# Patient Record
Sex: Male | Born: 1970 | Race: Black or African American | Hispanic: No | Marital: Married | Smoking: Current every day smoker
Health system: Southern US, Community
[De-identification: ages and names within clinical notes are randomized; demographics above are authoritative.]

## PROBLEM LIST (undated history)

## (undated) DIAGNOSIS — J449 Chronic obstructive pulmonary disease, unspecified: Secondary | ICD-10-CM

## (undated) DIAGNOSIS — E119 Type 2 diabetes mellitus without complications: Secondary | ICD-10-CM

## (undated) HISTORY — DX: Chronic obstructive pulmonary disease, unspecified: J44.9

## (undated) HISTORY — PX: FOOT SURGERY: SHX648

## (undated) HISTORY — DX: Type 2 diabetes mellitus without complications: E11.9

---
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
## 1998-04-13 ENCOUNTER — Emergency Department (HOSPITAL_COMMUNITY): Admission: EM | Admit: 1998-04-13 | Discharge: 1998-04-14 | Payer: Self-pay | Admitting: Internal Medicine

## 2013-02-10 ENCOUNTER — Emergency Department (HOSPITAL_COMMUNITY)
Admission: EM | Admit: 2013-02-10 | Discharge: 2013-02-10 | Disposition: A | Discharge status: Home / Self Care | Payer: Worker's Compensation | Attending: Emergency Medicine | Admitting: Emergency Medicine

## 2013-02-10 ENCOUNTER — Encounter (HOSPITAL_COMMUNITY): Payer: Self-pay | Admitting: Emergency Medicine

## 2013-02-10 DIAGNOSIS — F17200 Nicotine dependence, unspecified, uncomplicated: Secondary | ICD-10-CM | POA: Insufficient documentation

## 2013-02-10 DIAGNOSIS — W268XXA Contact with other sharp object(s), not elsewhere classified, initial encounter: Secondary | ICD-10-CM | POA: Insufficient documentation

## 2013-02-10 DIAGNOSIS — S0100XA Unspecified open wound of scalp, initial encounter: Secondary | ICD-10-CM | POA: Insufficient documentation

## 2013-02-10 DIAGNOSIS — Z23 Encounter for immunization: Secondary | ICD-10-CM | POA: Insufficient documentation

## 2013-02-10 DIAGNOSIS — S0191XA Laceration without foreign body of unspecified part of head, initial encounter: Secondary | ICD-10-CM

## 2013-02-10 DIAGNOSIS — Y929 Unspecified place or not applicable: Secondary | ICD-10-CM | POA: Insufficient documentation

## 2013-02-10 DIAGNOSIS — Y9389 Activity, other specified: Secondary | ICD-10-CM | POA: Insufficient documentation

## 2013-02-10 MED ORDER — TETANUS-DIPHTH-ACELL PERTUSSIS 5-2.5-18.5 LF-MCG/0.5 IM SUSP
0.5000 mL | Freq: Once | INTRAMUSCULAR | Status: AC
  Administered 2013-02-10: 0.5 mL via INTRAMUSCULAR
  Filled 2013-02-10: qty 0.5

## 2013-02-10 MED ORDER — IBUPROFEN 800 MG PO TABS
800.0000 mg | ORAL_TABLET | Freq: Once | ORAL | Status: AC
  Administered 2013-02-10: 800 mg via ORAL
  Filled 2013-02-10: qty 1

## 2013-02-10 MED ORDER — ACETAMINOPHEN 500 MG PO TABS: 500.0000 mg | ORAL_TABLET | Freq: Four times a day (QID) | ORAL | Status: DC | PRN

## 2013-02-10 NOTE — ED Provider Notes (Signed)
CSN: [id number]     Arrival date & time 02/10/13  0257 History     First MD Initiated Contact with Patient 02/10/13 [phone]     Chief Complaint  Patient presents with  . Head Laceration   (Consider location/radiation/quality/duration/timing/severity/associated sxs/prior Treatment) HPI Pt is a 42yo male presenting with laceration of his scalp after being cut after raising up under a piece of equipment at work at [employer].  Pt c/o mild aching headache, same as previous headaches, 4/10.  Moderate bleeding immediately after incident however bleeding well managed PTA.  Pt is otherwise healthy.  Not on blood thinners.  Last tetanus unknown. Denies LOC, n/v, or change in vision.   History reviewed. No pertinent past medical history. Past Surgical History  Procedure Laterality Date  . Foot surgery     No family history on file. History  Substance Use Topics  . Smoking status: Current Every Day Smoker  . Smokeless tobacco: Not on file  . Alcohol Use: No    Review of Systems  Skin: Positive for wound.  Neurological: Positive for headaches.  All other systems reviewed and are negative.    Allergies  Review of patient's allergies indicates no known allergies.  Home Medications   Current Outpatient Rx  Name  Route  Sig  Dispense  Refill  . acetaminophen (TYLENOL) 500 MG tablet   Oral   Take 1 tablet (500 mg total) by mouth every 6 (six) hours as needed for pain.   30 tablet   0    BP 132/80  Pulse 78  Temp(Src) 98.5 F (36.9 C) (Oral)  Resp 18  SpO2 99% Physical Exam  Nursing note and vitals reviewed. Constitutional: He is oriented to person, place, and time. He appears well-developed and well-nourished.  HENT:  Head: Normocephalic. Head is with laceration.    Right Ear: Hearing, tympanic membrane, external ear and ear canal normal.  Left Ear: Hearing, tympanic membrane, external ear and ear canal normal.  Nose: Nose normal.  Mouth/Throat: Uvula is midline and oropharynx is  clear and moist. No oropharyngeal exudate.  Eyes: Conjunctivae and EOM are normal. Pupils are equal, round, and reactive to light. Right eye exhibits no discharge. Left eye exhibits no discharge. No scleral icterus.  Neck: Normal range of motion. Neck supple.  Cardiovascular: Normal rate.   Pulmonary/Chest: Effort normal.  Musculoskeletal: Normal range of motion.  Neurological: He is alert and oriented to person, place, and time. He has normal strength. No cranial nerve deficit or sensory deficit. He displays a negative Romberg sign. Coordination normal. GCS eye subscore is 4. GCS verbal subscore is 5. GCS motor subscore is 6.  Skin: Skin is warm and dry.  Psychiatric: He has a normal mood and affect. His behavior is normal.    ED Course   Procedures  LACERATION REPAIR Performed by: Junius Finner A. Authorized by: Ina Homes Consent: Verbal consent obtained. Risks and benefits: risks, benefits and alternatives were discussed Consent given by: patient Patient identity confirmed: provided demographic data Prepped and Draped in normal sterile fashion Wound explored  Laceration Location: scalp  Laceration Length: 3cm  No Foreign Bodies seen or palpated  Local anesthetic: none  Irrigation method: syringe Amount of cleaning: standard  Skin closure: staples  Number of staples: 2  Patient tolerance: Patient tolerated the procedure well with no immediate complications.     Labs Reviewed - No data to display No results found. 1. Laceration of head, initial encounter     MDM  Pt has simple lac to scalp.  2 staples placed.  Pt updated with tetanus in ED.  Return precautions provided. Pt is to f/u with PCP or return to ER for staple removal in 7-10 days.  Pt verbalized understanding and agreement with tx plan.    Junius Finner, PA-C 02/11/13 2036

## 2013-02-10 NOTE — ED Notes (Signed)
Patient is alert and oriented x3.  He was given DC instructions and follow up visit instructions.  Patient gave verbal understanding.  He was DC ambulatory under his own power to home.  V/S stable.  He was not showing any signs of distress on DC 

## 2013-02-10 NOTE — ED Notes (Addendum)
Pt raised up under piece of equipment while at work tonight at [employer]. Dried blood noted to L side of head, pt denies LOC No drug screen required.

## 2013-02-11 NOTE — ED Provider Notes (Signed)
Medical screening examination/treatment/procedure(s) were performed by non-physician practitioner and as supervising physician I was immediately available for consultation/collaboration.   Nena Hampe L Daily Doe, MD 02/11/13 2251 

## 2016-08-16 ENCOUNTER — Emergency Department (HOSPITAL_COMMUNITY): Payer: Managed Care, Other (non HMO)

## 2016-08-16 ENCOUNTER — Emergency Department (HOSPITAL_COMMUNITY)
Admission: EM | Admit: 2016-08-16 | Discharge: 2016-08-17 | Disposition: A | Discharge status: Home / Self Care | Payer: Managed Care, Other (non HMO) | Attending: Emergency Medicine | Admitting: Emergency Medicine

## 2016-08-16 ENCOUNTER — Encounter (HOSPITAL_COMMUNITY): Payer: Self-pay

## 2016-08-16 DIAGNOSIS — R05 Cough: Secondary | ICD-10-CM | POA: Diagnosis present

## 2016-08-16 DIAGNOSIS — J111 Influenza due to unidentified influenza virus with other respiratory manifestations: Secondary | ICD-10-CM | POA: Insufficient documentation

## 2016-08-16 DIAGNOSIS — F17200 Nicotine dependence, unspecified, uncomplicated: Secondary | ICD-10-CM | POA: Diagnosis not present

## 2016-08-16 DIAGNOSIS — R69 Illness, unspecified: Secondary | ICD-10-CM

## 2016-08-16 MED ORDER — BENZONATATE 200 MG PO CAPS: 200.0000 mg | ORAL_CAPSULE | Freq: Three times a day (TID) | ORAL | 0 refills | Status: DC | PRN

## 2016-08-16 MED ORDER — BENZOCAINE-MENTHOL 6-10 MG MT LOZG: 1.0000 | LOZENGE | OROMUCOSAL | 0 refills | Status: DC | PRN

## 2016-08-16 MED ORDER — GUAIFENESIN-CODEINE 100-10 MG/5ML PO SYRP: 5.0000 mL | ORAL_SOLUTION | Freq: Every evening | ORAL | 0 refills | Status: DC | PRN

## 2016-08-16 MED ORDER — IPRATROPIUM-ALBUTEROL 0.5-2.5 (3) MG/3ML IN SOLN
3.0000 mL | Freq: Once | RESPIRATORY_TRACT | Status: AC
  Administered 2016-08-16: 3 mL via RESPIRATORY_TRACT
  Filled 2016-08-16: qty 3

## 2016-08-16 NOTE — ED Notes (Signed)
See EDP assessment 

## 2016-08-16 NOTE — ED Provider Notes (Signed)
[facility] DEPT Provider Note   CSN: [id number] Arrival date & time: 08/16/16  1854   By signing my name below, I, Eunice Blase, attest that this documentation has been prepared under the direction and in the presence of [facility] [place]. Electronically Signed: Eunice Blase, Scribe. 08/16/16. 10:51 PM.   History   Chief Complaint Chief Complaint  Patient presents with  . Influenza   The history is provided by the patient and medical records. No language interpreter was used.    10:32 PM Pt not in the room.  HPI Comments: Edward Downs is a 46 y.o. male who presents to the Emergency Department complaining of gradually worsening throat discomfort x ~4 days. Pt notes associated body aches, productive cough, bloody cough x 1 today, congestion, fever, headache, sinus pain, abdominal pain that has subsided and wheezing. He adds he drank tea and took cough drops at home with no relief. Pt notes he is a smoker.  History reviewed. No pertinent past medical history.  There are no active problems to display for this patient.   Past Surgical History:  Procedure Laterality Date  . FOOT SURGERY         Home Medications    Prior to Admission medications   Medication Sig Start Date End Date Taking? Authorizing Provider  acetaminophen (TYLENOL) 500 MG tablet Take 1 tablet (500 mg total) by mouth every 6 (six) hours as needed for pain. 02/10/13   Noland Fordyce, PA-C  benzocaine-menthol (CHLORASEPTIC) 6-10 MG lozenge Take 1 lozenge by mouth as needed for sore throat. 08/16/16   Jenny Lai Bunnie Pion, NP  benzonatate (TESSALON) 200 MG capsule Take 1 capsule (200 mg total) by mouth 3 (three) times daily as needed for cough. 08/16/16   Samamtha Tiegs Bunnie Pion, NP  guaiFENesin-codeine (ROBITUSSIN AC) 100-10 MG/5ML syrup Take 5 mLs by mouth at bedtime as needed for cough. 08/16/16   Johni Narine Bunnie Pion, NP    Family History No family history on file.  Social History Social History  Substance Use Topics  .  Smoking status: Current Every Day Smoker  . Smokeless tobacco: Not on file  . Alcohol use No     Allergies   Patient has no known allergies.   Review of Systems Review of Systems  Constitutional: Positive for fatigue and fever.  HENT: Positive for congestion, rhinorrhea, sinus pain, sinus pressure and sore throat.   Respiratory: Positive for cough and wheezing.   Gastrointestinal: Positive for abdominal pain.  Musculoskeletal: Positive for arthralgias and myalgias.  All other systems reviewed and are negative.    Physical Exam Updated Vital Signs BP 123/79   Pulse 88   Temp 99.6 F (37.6 C) (Oral)   Resp 18   Ht 6\' 3"  (1.905 m)   Wt 175 lb (79.4 kg)   SpO2 100%   BMI 21.87 kg/m   Physical Exam  Constitutional: He is oriented to person, place, and time. He appears well-developed and well-nourished. No distress.  HENT:  Head: Normocephalic and atraumatic.  Right Ear: Tympanic membrane normal.  Left Ear: Tympanic membrane normal.  Nose: Right sinus exhibits no maxillary sinus tenderness and no frontal sinus tenderness. Left sinus exhibits no maxillary sinus tenderness and no frontal sinus tenderness.  Mouth/Throat: Uvula is midline and mucous membranes are normal. Posterior oropharyngeal erythema (mild) present. No oropharyngeal exudate or posterior oropharyngeal edema.  Eyes: EOM are normal. Pupils are equal, round, and reactive to light. No scleral icterus.  Neck: Normal range of motion. Neck supple.  Cardiovascular: Normal rate and regular rhythm.   Pulmonary/Chest: Effort normal. He has wheezes (inspiratory and expiratory).  Abdominal: Soft. There is no tenderness.  Musculoskeletal: Normal range of motion.  Lymphadenopathy:    He has no cervical adenopathy.  Neurological: He is alert and oriented to person, place, and time.  Skin: Skin is warm and dry. No rash noted.  Psychiatric: He has a normal mood and affect. His behavior is normal.  Nursing note and vitals  reviewed.    ED Treatments / Results  DIAGNOSTIC STUDIES: Oxygen Saturation is 100% on RA, normal by my interpretation.    COORDINATION OF CARE: 10:50 PM Discussed treatment plan with pt at bedside and pt agreed to plan. Will provide breathing treatment, order imaging and reassess.  Labs (all labs ordered are listed, but only abnormal results are displayed)  Radiology Dg Chest 2 View  Result Date: 08/16/2016 CLINICAL DATA:  Hemoptysis and wheezing, fever. EXAM: CHEST  2 VIEW COMPARISON:  None. FINDINGS: Cardiomediastinal silhouette is normal. No pleural effusions or focal consolidations. Trachea projects midline and there is no pneumothorax. Soft tissue planes and included osseous structures are non-suspicious. Mid thoracic dextroscoliosis and mild degenerative change. IMPRESSION: Negative chest radiograph. Electronically Signed   By: Elon Alas M.D.   On: 08/16/2016 23:23    Procedures Procedures (including critical care time)  Medications Ordered in ED Medications  albuterol (PROVENTIL HFA;VENTOLIN HFA) 108 (90 Base) MCG/ACT inhaler 2 puff (not administered)  ipratropium-albuterol (DUONEB) 0.5-2.5 (3) MG/3ML nebulizer solution 3 mL (3 mLs Nebulization Given 08/16/16 2333)     Initial Impression / Assessment and Plan / ED Course  I have reviewed the triage vital signs and the nursing notes.  Pertinent imaging results that were available during my care of the patient were reviewed by me and considered in my medical decision making (see chart for details).   Patient improved with neb treatment   I personally performed the services described in this documentation, which was scribed in my presence. The recorded information has been reviewed and is accurate.   Final Clinical Impressions(s) / ED Diagnoses  SUBJECTIVE:  Edward Downs is a 46 y.o. male who present complaining of flu-like symptoms: fevers, chills, myalgias, congestion, sore throat and cough for 4 days. Denies  dyspnea  OBJECTIVE: Appears moderately ill but not toxic; temperature as noted in vitals. Ears normal. Throat and pharynx normal.  Neck supple. No adenopathy in the neck. Sinuses non tender. The chest is clear.  ASSESSMENT: Influenza  PLAN: Symptomatic therapy suggested: rest, increase fluids, gargle prn for sore throat, use mist of vaporizer prn and return prn if symptoms persist or worsen . Cough medication and Albuterol inhaler.  Final diagnoses:  Influenza-like illness    New Prescriptions New Prescriptions   BENZOCAINE-MENTHOL (CHLORASEPTIC) 6-10 MG LOZENGE    Take 1 lozenge by mouth as needed for sore throat.   BENZONATATE (TESSALON) 200 MG CAPSULE    Take 1 capsule (200 mg total) by mouth 3 (three) times daily as needed for cough.   GUAIFENESIN-CODEINE (ROBITUSSIN AC) 100-10 MG/5ML SYRUP    Take 5 mLs by mouth at bedtime as needed for cough.     [address] [place], NP 08/17/16 0010    Lacretia Leigh, MD 08/19/16 [phone]

## 2016-08-16 NOTE — ED Triage Notes (Signed)
Pt states that since Friday has had flu like symptoms, headache, head congestion, cough, nausea. Denies fevers

## 2016-08-17 MED ORDER — ALBUTEROL SULFATE HFA 108 (90 BASE) MCG/ACT IN AERS
2.0000 | INHALATION_SPRAY | RESPIRATORY_TRACT | Status: DC | PRN
  Administered 2016-08-17: 2 via RESPIRATORY_TRACT
  Filled 2016-08-17: qty 6.7

## 2017-11-15 IMAGING — CR DG CHEST 2V
2 series · 2 of 2 positions shown · non-contrast
Comparison: None.

CLINICAL DATA: Hemoptysis and wheezing, fever.

EXAM:
CHEST  2 VIEW

[chest pa]
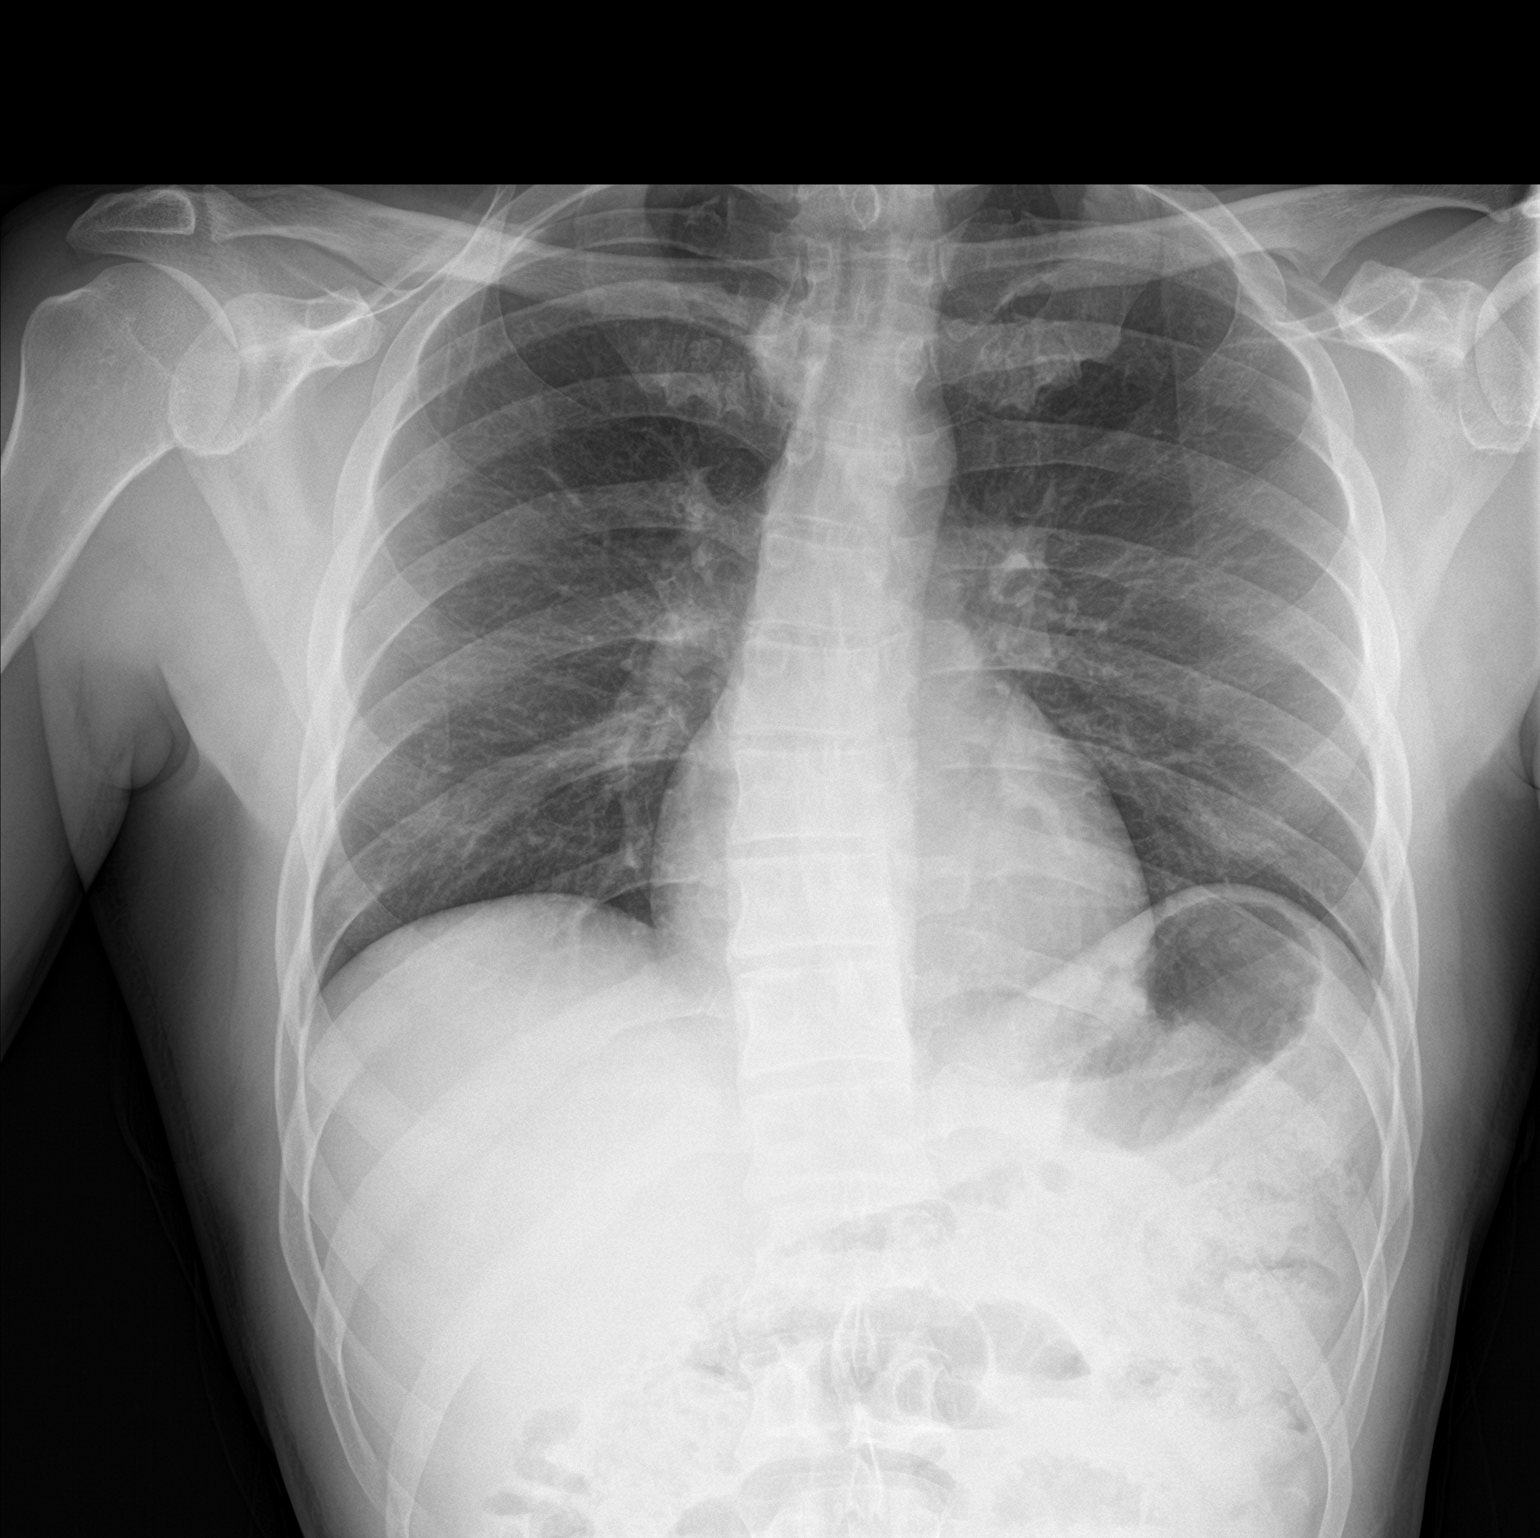

[chest lat]
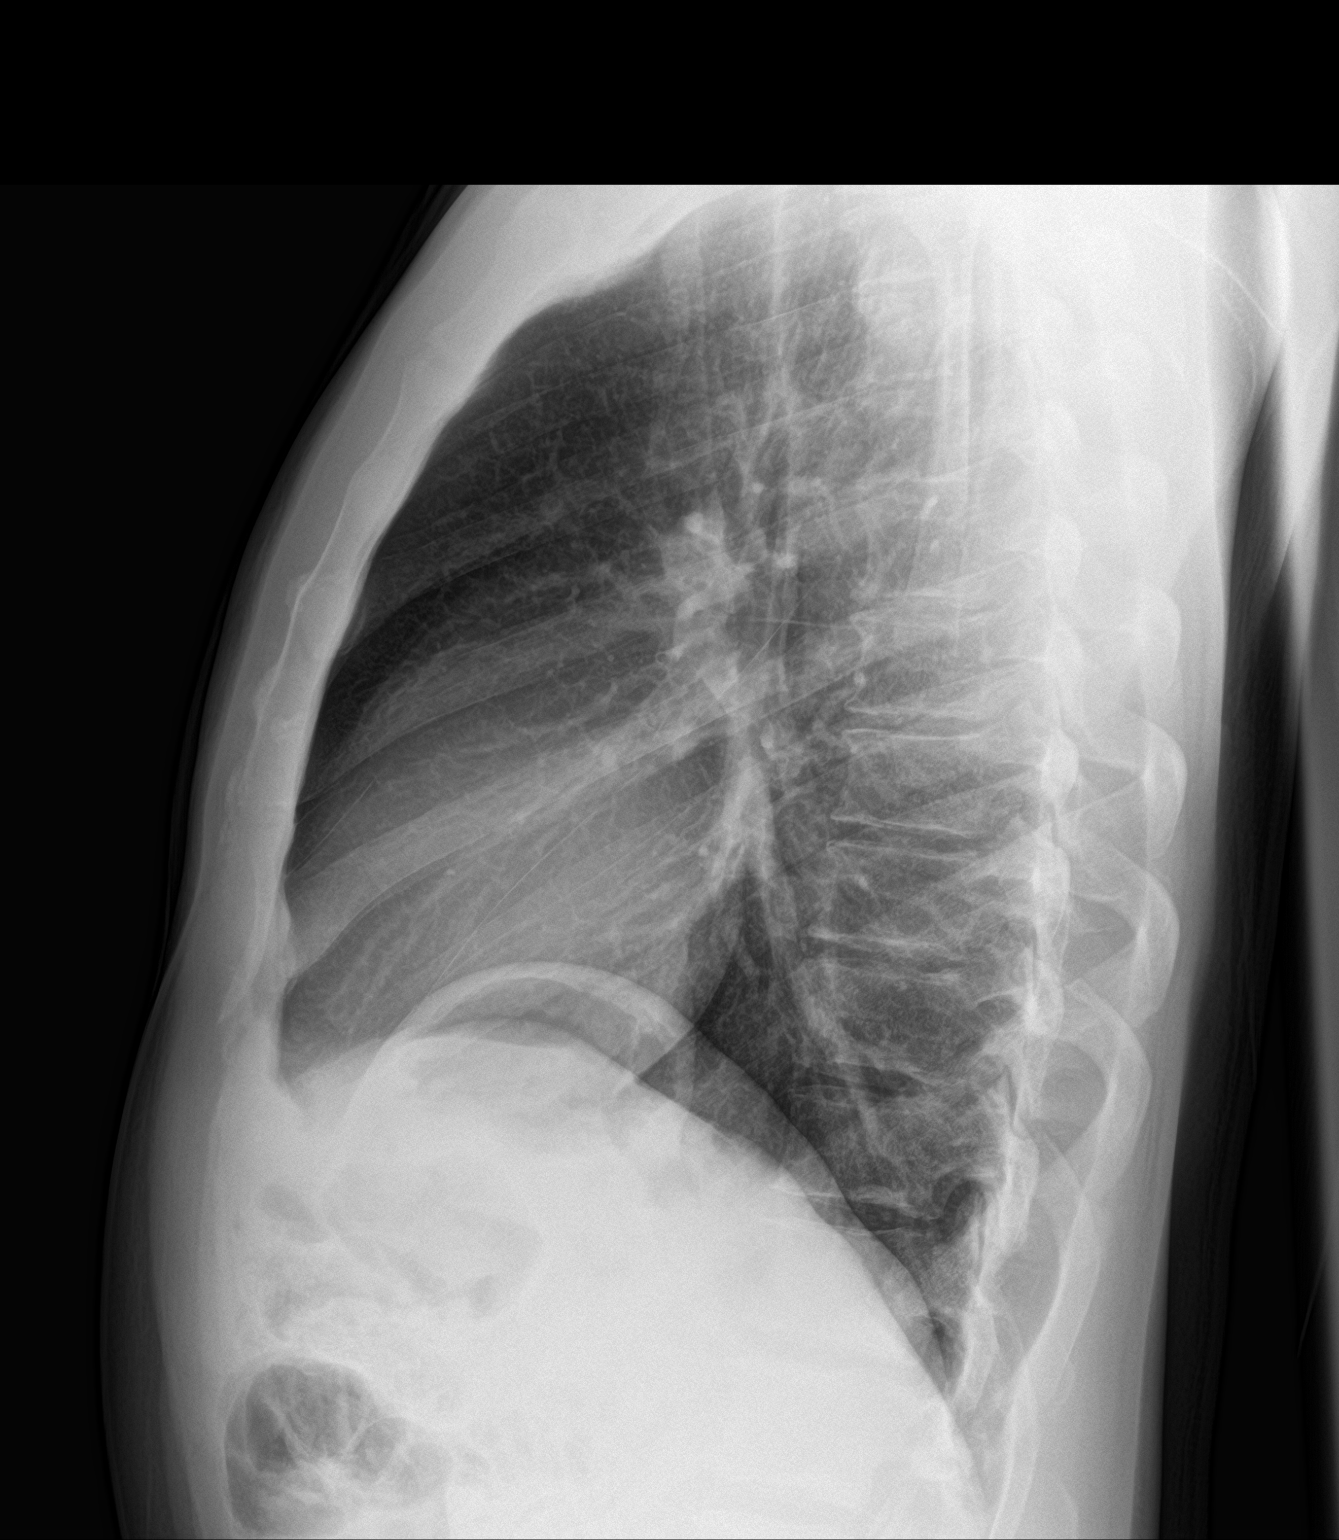

[2 of 2 positions shown; findings below may reference images not displayed]

FINDINGS: Cardiomediastinal silhouette is normal. No pleural effusions or
focal consolidations. Trachea projects midline and there is no
pneumothorax. Soft tissue planes and included osseous structures are
non-suspicious. Mid thoracic dextroscoliosis and mild degenerative
change.
IMPRESSION: Negative chest radiograph.

## 2018-02-14 ENCOUNTER — Encounter: Payer: Self-pay | Admitting: Medical

## 2018-07-05 DIAGNOSIS — L403 Pustulosis palmaris et plantaris: Secondary | ICD-10-CM

## 2018-07-05 HISTORY — DX: Pustulosis palmaris et plantaris: L40.3

## 2019-03-09 ENCOUNTER — Other Ambulatory Visit: Payer: Self-pay

## 2019-03-09 ENCOUNTER — Encounter: Payer: Self-pay | Admitting: Emergency Medicine

## 2019-03-09 ENCOUNTER — Ambulatory Visit
Admission: EM | Admit: 2019-03-09 | Discharge: 2019-03-09 | Disposition: A | Discharge status: Home / Self Care | Payer: Managed Care, Other (non HMO) | Attending: Physician Assistant | Admitting: Physician Assistant

## 2019-03-09 DIAGNOSIS — R52 Pain, unspecified: Secondary | ICD-10-CM | POA: Diagnosis not present

## 2019-03-09 DIAGNOSIS — R430 Anosmia: Secondary | ICD-10-CM

## 2019-03-09 DIAGNOSIS — Z20828 Contact with and (suspected) exposure to other viral communicable diseases: Secondary | ICD-10-CM | POA: Diagnosis not present

## 2019-03-09 DIAGNOSIS — Z20822 Contact with and (suspected) exposure to covid-19: Secondary | ICD-10-CM

## 2019-03-09 DIAGNOSIS — R432 Parageusia: Secondary | ICD-10-CM | POA: Diagnosis not present

## 2019-03-09 MED ORDER — ONDANSETRON 4 MG PO TBDP: 4.0000 mg | ORAL_TABLET | Freq: Three times a day (TID) | ORAL | 0 refills | Status: DC | PRN

## 2019-03-09 NOTE — Discharge Instructions (Signed)
As discussed, cannot rule out COVID. Currently, no alarming signs. Testing ordered. I would like you to quarantine until testing results. You can take tylenol for body aches/fever. You can take zofran for nausea/vomiting. Keep hydrated, urine should be clear to pale yellow in color. If experiencing shortness of breath, trouble breathing, call 911 and provide them with your current situation.

## 2019-03-09 NOTE — ED Triage Notes (Signed)
Patient presents to [facility] for assessment of muscle aches with movement x 2 days, with some fatigue, headaches, and dizziness.  C/o loss of sense of taste.  Denies fevers or chills.  Denies cough, nasal congestion or sore throat.

## 2019-03-09 NOTE — ED Provider Notes (Signed)
[name]    CSN: [id number] Arrival date & time: 03/09/19  1336      History   Chief Complaint Chief Complaint  Patient presents with  . Fatigue    HPI Edward Downs is a 48 y.o. male.   48 year old male comes in for 2-day history of body aches, fatigue, headache, dizziness.  He also complains of loss of taste and smell.  She had nausea and vomiting after eating an orange today, but has since then been able to tolerate oral intake.  Denies fever, chills.  Denies cough, congestion, sore throat.  Denies shortness of breath, wheezing.  Denies abdominal pain, diarrhea.  No obvious sick contact.  Works 2 jobs with no obvious COVID contact.  Has not taken any medications.     History reviewed. No pertinent past medical history.  There are no active problems to display for this patient.   Past Surgical History:  Procedure Laterality Date  . FOOT SURGERY         Home Medications    Prior to Admission medications   Medication Sig Start Date End Date Taking? Authorizing Provider  acetaminophen (TYLENOL) 500 MG tablet Take 1 tablet (500 mg total) by mouth every 6 (six) hours as needed for pain. 02/10/13   [name], PA-C  ondansetron (ZOFRAN ODT) 4 MG disintegrating tablet Take 1 tablet (4 mg total) by mouth every 8 (eight) hours as needed for nausea or vomiting. 03/09/19   [name], PA-C    Family History Family History  Problem Relation Age of Onset  . Diabetes Mother     Social History Social History   Tobacco Use  . Smoking status: Current Every Day Smoker    Packs/day: 0.50  . Smokeless tobacco: Never Used  Substance Use Topics  . Alcohol use: No  . Drug use: No     Allergies   Patient has no known allergies.   Review of Systems Review of Systems  Reason unable to perform ROS: See HPI as above.     Physical Exam Triage Vital Signs ED Triage Vitals [03/09/19 1346]  Enc Vitals Group     BP 113/78     Pulse Rate (!) 116   Resp 18     Temp 98.9 F (37.2 C)     Temp Source Oral     SpO2 97 %     Weight      Height      Head Circumference      Peak Flow      Pain Score 5     Pain Loc      Pain Edu?      Excl. in [place]?    No data found.  Updated Vital Signs BP 113/78 (BP Location: Left Arm)   Pulse (!) 116   Temp 98.9 F (37.2 C) (Oral)   Resp 18   SpO2 97%   Physical Exam Constitutional:      General: He is not in acute distress.    Appearance: Normal appearance. He is not ill-appearing, toxic-appearing or diaphoretic.  HENT:     Head: Normocephalic and atraumatic.     Mouth/Throat:     Mouth: Mucous membranes are moist.     Pharynx: Oropharynx is clear. Uvula midline.  Neck:     Musculoskeletal: Normal range of motion and neck supple.  Cardiovascular:     Rate and Rhythm: Regular rhythm. Tachycardia present.     Heart sounds: Normal heart sounds. No  murmur. No friction rub. No gallop.   Pulmonary:     Effort: Pulmonary effort is normal. No accessory muscle usage, prolonged expiration, respiratory distress or retractions.     Comments: Lungs clear to auscultation without adventitious lung sounds. Skin:    General: Skin is warm and dry.  Neurological:     General: No focal deficit present.     Mental Status: He is alert and oriented to person, place, and time.     Comments: Patient able to ambulate on own without difficulty      UC Treatments / Results  Labs (all labs ordered are listed, but only abnormal results are displayed) Labs Reviewed  NOVEL CORONAVIRUS, NAA    EKG   Radiology No results found.  Procedures Procedures (including critical care time)  Medications Ordered in UC Medications - No data to display  Initial Impression / Assessment and Plan / UC Course  I have reviewed the triage vital signs and the nursing notes.  Pertinent labs & imaging results that were available during my care of the patient were reviewed by me and considered in my medical decision  making (see chart for details).    No alarming signs on exam.  Patient speaking in full sentences without respiratory distress.  COVID testing ordered.  Patient to quarantine until testing results return.  Symptomatic treatment discussed.  Push fluids.  Return precautions given.  Patient expresses understanding and agrees to plan.  Final Clinical Impressions(s) / UC Diagnoses   Final diagnoses:  Suspected Covid-19 Virus Infection  Loss of taste  Loss of smell  Body aches   ED Prescriptions    Medication Sig Dispense Auth. Provider   ondansetron (ZOFRAN ODT) 4 MG disintegrating tablet Take 1 tablet (4 mg total) by mouth every 8 (eight) hours as needed for nausea or vomiting. 15 tablet Tobin Chad, [place] 03/09/19 1432

## 2019-03-10 ENCOUNTER — Other Ambulatory Visit: Payer: Self-pay

## 2019-03-10 ENCOUNTER — Ambulatory Visit
Admission: EM | Admit: 2019-03-10 | Discharge: 2019-03-10 | Disposition: A | Discharge status: Home / Self Care | Payer: Managed Care, Other (non HMO) | Attending: Physician Assistant | Admitting: Physician Assistant

## 2019-03-10 DIAGNOSIS — R2242 Localized swelling, mass and lump, left lower limb: Secondary | ICD-10-CM | POA: Diagnosis not present

## 2019-03-10 DIAGNOSIS — L03116 Cellulitis of left lower limb: Secondary | ICD-10-CM | POA: Diagnosis not present

## 2019-03-10 LAB — NOVEL CORONAVIRUS, NAA: SARS-CoV-2, NAA: NOT DETECTED

## 2019-03-10 MED ORDER — CEPHALEXIN 500 MG PO CAPS: 500.0000 mg | ORAL_CAPSULE | Freq: Four times a day (QID) | ORAL | 0 refills | Status: DC

## 2019-03-10 MED ORDER — CETIRIZINE HCL 10 MG PO TABS: 10.0000 mg | ORAL_TABLET | Freq: Two times a day (BID) | ORAL | 0 refills | Status: DC

## 2019-03-10 NOTE — ED Triage Notes (Signed)
Per pt he started hurting in his left leg yesterday and noticed redness and swelling. Very splotchy and red all the way around his lower calf area of his leg. No obvious bite. No fevers, leg is warm to touch

## 2019-03-10 NOTE — Discharge Instructions (Signed)
Start keflex as directed for infection. Zyrtec for possible insect bite. As discussed, lower suspicion for blood clot right now. However, if swelling increases, to the whole leg with pain to the calf, go to the ED for further evaluation needed.

## 2019-03-10 NOTE — ED Provider Notes (Signed)
[name]    CSN: [id number] Arrival date & time: 03/10/19  0816      History   Chief Complaint Chief Complaint  Patient presents with  . Leg Swelling    HPI Edward Downs is a 48 y.o. male.   48 year old male comes in for left lower leg swelling, redness, pain that started last night. He was seen yesterday for URI symptom, possible COVID, with test pending. Later that day, started noticing current symptoms. He denies injury/trauma. He questions insect bite, though did not see any bugs. Denies fever, chills. Denies long travels, history of DVT.      No past medical history on file.  There are no active problems to display for this patient.   Past Surgical History:  Procedure Laterality Date  . FOOT SURGERY         Home Medications    Prior to Admission medications   Medication Sig Start Date End Date Taking? Authorizing Provider  acetaminophen (TYLENOL) 500 MG tablet Take 1 tablet (500 mg total) by mouth every 6 (six) hours as needed for pain. 02/10/13   [name], PA-C  cephALEXin (KEFLEX) 500 MG capsule Take 1 capsule (500 mg total) by mouth 4 (four) times daily. 03/10/19   [name], Lachae Hohler V, PA-C  cetirizine (ZYRTEC ALLERGY) 10 MG tablet Take 1 tablet (10 mg total) by mouth 2 (two) times daily. 03/10/19   [name], Dell Briner V, PA-C  ondansetron (ZOFRAN ODT) 4 MG disintegrating tablet Take 1 tablet (4 mg total) by mouth every 8 (eight) hours as needed for nausea or vomiting. 03/09/19   [name], PA-C    Family History Family History  Problem Relation Age of Onset  . Diabetes Mother     Social History Social History   Tobacco Use  . Smoking status: Current Every Day Smoker    Packs/day: 0.50  . Smokeless tobacco: Never Used  Substance Use Topics  . Alcohol use: No  . Drug use: No     Allergies   Patient has no known allergies.   Review of Systems Review of Systems  Reason unable to perform ROS: See HPI as above.     Physical Exam Triage Vital  Signs ED Triage Vitals [03/10/19 0831]  Enc Vitals Group     BP 126/87     Pulse Rate 99     Resp 18     Temp 98.5 F (36.9 C)     Temp Source Oral     SpO2 99 %     Weight      Height      Head Circumference      Peak Flow      Pain Score 7     Pain Loc      Pain Edu?      Excl. in [place]?    No data found.  Updated Vital Signs BP 126/87 (BP Location: Right Arm)   Pulse 99   Temp 98.5 F (36.9 C) (Oral)   Resp 18   SpO2 99%   Physical Exam Constitutional:      General: He is not in acute distress.    Appearance: He is well-developed. He is not diaphoretic.  HENT:     Head: Normocephalic and atraumatic.  Eyes:     Conjunctiva/sclera: Conjunctivae normal.     Pupils: Pupils are equal, round, and reactive to light.  Pulmonary:     Effort: Pulmonary effort is normal. No respiratory distress.  Skin:    General: Skin is warm and dry.     Comments: Patches of erythema, warmth, swelling to the left lowe leg. No pitting edema. Mild induration. No calf swelling, homans.   Neurological:     Mental Status: He is alert and oriented to person, place, and time.            UC Treatments / Results  Labs (all labs ordered are listed, but only abnormal results are displayed) Labs Reviewed - No data to display  EKG   Radiology No results found.  Procedures Procedures (including critical care time)  Medications Ordered in UC Medications - No data to display  Initial Impression / Assessment and Plan / UC Course  I have reviewed the triage vital signs and the nursing notes.  Pertinent labs & imaging results that were available during my care of the patient were reviewed by me and considered in my medical decision making (see chart for details).    Lower suspicion of DVT at this time given no diffuse swelling, calf swelling, homans sign. Patchy erythema with no obvious insect bites. Given tenderness and warmth, will cover for cellulitis with keflex. Will start  zyrtec to cover for insect bites. Return precautions given. Discussed if needing to go to ED for further evaluation, will need to inform them he has current COVID testing pending. Patient expresses understanding and agrees to plan.  Final Clinical Impressions(s) / UC Diagnoses   Final diagnoses:  Localized swelling of left lower leg  Cellulitis of left lower leg   ED Prescriptions    Medication Sig Dispense Auth. Provider   cephALEXin (KEFLEX) 500 MG capsule  (Status: Discontinued) Take 1 capsule (500 mg total) by mouth 4 (four) times daily. 28 capsule Todrick Siedschlag V, PA-C   cetirizine (ZYRTEC ALLERGY) 10 MG tablet  (Status: Discontinued) Take 1 tablet (10 mg total) by mouth 2 (two) times daily. 20 tablet [place] Kinyon V, PA-C   cephALEXin (KEFLEX) 500 MG capsule Take 1 capsule (500 mg total) by mouth 4 (four) times daily. 28 capsule [place] Leap V, PA-C   cetirizine (ZYRTEC ALLERGY) 10 MG tablet Take 1 tablet (10 mg total) by mouth 2 (two) times daily. 20 tablet Tobin Chad, PA-C 03/10/19 1100

## 2019-03-11 ENCOUNTER — Emergency Department (HOSPITAL_COMMUNITY)
Admission: EM | Admit: 2019-03-11 | Discharge: 2019-03-11 | Disposition: A | Discharge status: Home / Self Care | Payer: Managed Care, Other (non HMO) | Attending: Emergency Medicine | Admitting: Emergency Medicine

## 2019-03-11 ENCOUNTER — Other Ambulatory Visit: Payer: Self-pay

## 2019-03-11 ENCOUNTER — Encounter (HOSPITAL_COMMUNITY): Payer: Self-pay

## 2019-03-11 DIAGNOSIS — M79662 Pain in left lower leg: Secondary | ICD-10-CM | POA: Diagnosis present

## 2019-03-11 DIAGNOSIS — L03116 Cellulitis of left lower limb: Secondary | ICD-10-CM | POA: Insufficient documentation

## 2019-03-11 DIAGNOSIS — Z79899 Other long term (current) drug therapy: Secondary | ICD-10-CM | POA: Diagnosis not present

## 2019-03-11 DIAGNOSIS — F17200 Nicotine dependence, unspecified, uncomplicated: Secondary | ICD-10-CM | POA: Insufficient documentation

## 2019-03-11 MED ORDER — CLINDAMYCIN HCL 300 MG PO CAPS: 300.0000 mg | ORAL_CAPSULE | Freq: Three times a day (TID) | ORAL | 0 refills | Status: AC

## 2019-03-11 NOTE — ED Triage Notes (Signed)
Pt states he was seen at [facility] for same yesterday. Pt states he was given abx yesterday and has not had relief from pain in left lower leg. Leg warm, red.

## 2019-03-11 NOTE — ED Notes (Signed)
MD [name] marking skin area to watch for increased inflammation

## 2019-03-11 NOTE — Discharge Instructions (Addendum)
Please return to the ED if redness and swelling worsens.

## 2019-03-11 NOTE — ED Provider Notes (Signed)
[facility] DEPT Provider Note   CSN: [id number] Arrival date & time: 03/11/19  1240     History   Chief Complaint Chief Complaint  Patient presents with  . Leg Pain    HPI Edward Downs is a 48 y.o. male.     Patient currently be treated for cellulitis of the left lower extremity, was having some tingling sensation in this area.  Currently on Keflex.  Denies any fever, chills.  Denies any trauma.  The history is provided by the patient.  Leg Pain Location:  Leg Leg location:  L lower leg Pain details:    Quality:  Aching   Radiates to:  Does not radiate   Severity:  Mild   Onset quality:  Gradual   Timing:  Intermittent   Progression:  Waxing and waning Prior injury to area:  No Relieved by:  Nothing Worsened by:  Nothing Associated symptoms: swelling   Associated symptoms: no back pain, no decreased ROM, no fatigue and no fever     History reviewed. No pertinent past medical history.  There are no active problems to display for this patient.   Past Surgical History:  Procedure Laterality Date  . FOOT SURGERY          Home Medications    Prior to Admission medications   Medication Sig Start Date End Date Taking? Authorizing Provider  acetaminophen (TYLENOL) 500 MG tablet Take 1 tablet (500 mg total) by mouth every 6 (six) hours as needed for pain. 02/10/13   [name], PA-C  cephALEXin (KEFLEX) 500 MG capsule Take 1 capsule (500 mg total) by mouth 4 (four) times daily. 03/10/19   [name], Amy V, PA-C  cetirizine (ZYRTEC ALLERGY) 10 MG tablet Take 1 tablet (10 mg total) by mouth 2 (two) times daily. 03/10/19   [name], Amy V, PA-C  clindamycin (CLEOCIN) 300 MG capsule Take 1 capsule (300 mg total) by mouth 3 (three) times daily for 10 days. 03/11/19 03/21/19  Hae Ahlers, DO  ondansetron (ZOFRAN ODT) 4 MG disintegrating tablet Take 1 tablet (4 mg total) by mouth every 8 (eight) hours as needed for nausea or vomiting. 03/09/19   [name], PA-C    Family History Family History  Problem Relation Age of Onset  . Diabetes Mother     Social History Social History   Tobacco Use  . Smoking status: Current Every Day Smoker    Packs/day: 0.50  . Smokeless tobacco: Never Used  Substance Use Topics  . Alcohol use: No  . Drug use: No     Allergies   Patient has no known allergies.   Review of Systems Review of Systems  Constitutional: Negative for chills, fatigue and fever.  HENT: Negative for ear pain and sore throat.   Eyes: Negative for pain and visual disturbance.  Respiratory: Negative for cough and shortness of breath.   Cardiovascular: Negative for chest pain and palpitations.  Gastrointestinal: Negative for abdominal pain and vomiting.  Genitourinary: Negative for dysuria and hematuria.  Musculoskeletal: Negative for arthralgias and back pain.  Skin: Positive for color change. Negative for rash.  Neurological: Negative for seizures and syncope.  All other systems reviewed and are negative.    Physical Exam Updated Vital Signs  ED Triage Vitals [03/11/19 1252]  Enc Vitals Group     BP 126/86     Pulse Rate 97     Resp 14     Temp 99.4 F (37.4 C)  Temp src      SpO2 100 %     Weight 190 lb (86.2 kg)     Height 6\' 3"  (1.905 m)     Head Circumference      Peak Flow      Pain Score 7     Pain Loc      Pain Edu?      Excl. in [place]?     Physical Exam Vitals signs and nursing note reviewed.  Constitutional:      Appearance: He is well-developed.  HENT:     Head: Normocephalic and atraumatic.  Eyes:     Conjunctiva/sclera: Conjunctivae normal.  Neck:     Musculoskeletal: Neck supple.  Cardiovascular:     Rate and Rhythm: Normal rate and regular rhythm.     Pulses: Normal pulses.     Heart sounds: Normal heart sounds. No murmur.  Pulmonary:     Effort: Pulmonary effort is normal. No respiratory distress.     Breath sounds: Normal breath sounds.  Abdominal:     Palpations:  Abdomen is soft.     Tenderness: There is no abdominal tenderness.  Skin:    General: Skin is warm and dry.     Capillary Refill: Capillary refill takes less than 2 seconds.     Comments: Redness and swelling to the left anterior shin  Neurological:     General: No focal deficit present.     Mental Status: He is alert.     Sensory: No sensory deficit.     Motor: No weakness.      ED Treatments / Results  Labs (all labs ordered are listed, but only abnormal results are displayed) Labs Reviewed - No data to display  EKG None  Radiology No results found.  Procedures Procedures (including critical care time)  Medications Ordered in ED Medications - No data to display   Initial Impression / Assessment and Plan / ED Course  I have reviewed the triage vital signs and the nursing notes.  Pertinent labs & imaging results that were available during my care of the patient were reviewed by me and considered in my medical decision making (see chart for details).     Edward Downs is a 48 year old male with history of allergies who presents to the ED with left lower leg swelling and redness.  Patient started on antibiotic yesterday for cellulitis.  Has good pulses in his legs.  Swelling is to the anterior left shin.  States that he had a bug bite several days ago and redness has gotten worse.  Has been on Keflex for 2 doses.  Patient states that redness is about the same as it was yesterday.  He was saying that he was just having some weird sensation feelings in the left lower leg.  Overall gave him reassurance.  Suspect that this is a cellulitis secondary to a bug bite.  Patient is not a diabetic.  We will switch him over to clindamycin for more broader coverage.  Outlined the wound and marker.  Told him to get evaluated in 48 hours if swelling is worse but to come back sooner if redness and swelling rapidly progressed.  However it seems that the redness and swelling has been pretty  stable for the last 24 hours.  Patient given reassurance and discharged in ED in good condition.  He understands return precautions.  This chart was dictated using voice recognition software.  Despite best efforts to proofread,  errors can occur  which can change the documentation meaning.    Final Clinical Impressions(s) / ED Diagnoses   Final diagnoses:  Cellulitis of left lower extremity    ED Discharge Orders         Ordered    clindamycin (CLEOCIN) 300 MG capsule  3 times daily     03/11/19 [place], [place], DO 03/11/19 1607

## 2019-03-15 ENCOUNTER — Ambulatory Visit
Admission: EM | Admit: 2019-03-15 | Discharge: 2019-03-15 | Disposition: A | Discharge status: Home / Self Care | Payer: Managed Care, Other (non HMO)

## 2019-03-15 ENCOUNTER — Other Ambulatory Visit: Payer: Self-pay

## 2019-03-15 ENCOUNTER — Encounter: Payer: Self-pay | Admitting: Emergency Medicine

## 2019-03-15 DIAGNOSIS — Z0289 Encounter for other administrative examinations: Secondary | ICD-10-CM | POA: Diagnosis not present

## 2019-03-15 DIAGNOSIS — L03116 Cellulitis of left lower limb: Secondary | ICD-10-CM | POA: Diagnosis not present

## 2019-03-15 NOTE — ED Triage Notes (Signed)
Pt presents to [facility] for an extended work note from his visit to the ER this weekend for cellulitis.  States he is supposed to go back today, but he doesn't want to go back to soon and make his leg worse.  States it hurts when he stands for extended periods of time.

## 2019-03-15 NOTE — Discharge Instructions (Signed)
Continue medications as prescribed. Continue to elevate foot.

## 2019-03-15 NOTE — ED Provider Notes (Signed)
[name]    CSN: [id number] Arrival date & time: 03/15/19  1445      History   Chief Complaint Chief Complaint  Patient presents with  . Work Note    HPI Edward Downs is a 48 y.o. male.   48 year old male comes in for work note. Patient was seen for cellulitis 03/10/2019 and put on keflex, he then went to the ED 03/11/2019 due to worsening symptoms and was put on clindamycin. He has been on clindamycin as directed with good relief. States still has pain with long standing and walking, and still with some residual leg swelling. However, erythema has resolved. Denies fever. States work requires long hours of standing, and would like "a few more days off" to help.       History reviewed. No pertinent past medical history.  There are no active problems to display for this patient.   Past Surgical History:  Procedure Laterality Date  . FOOT SURGERY         Home Medications    Prior to Admission medications   Medication Sig Start Date End Date Taking? Authorizing Provider  acetaminophen (TYLENOL) 500 MG tablet Take 1 tablet (500 mg total) by mouth every 6 (six) hours as needed for pain. 02/10/13   [name], PA-C  cetirizine (ZYRTEC ALLERGY) 10 MG tablet Take 1 tablet (10 mg total) by mouth 2 (two) times daily. 03/10/19   [name], Colen Eltzroth V, PA-C  clindamycin (CLEOCIN) 300 MG capsule Take 1 capsule (300 mg total) by mouth 3 (three) times daily for 10 days. 03/11/19 03/21/19  [name], [name], DO  ondansetron (ZOFRAN ODT) 4 MG disintegrating tablet Take 1 tablet (4 mg total) by mouth every 8 (eight) hours as needed for nausea or vomiting. 03/09/19   [name], PA-C    Family History Family History  Problem Relation Age of Onset  . Diabetes Mother     Social History Social History   Tobacco Use  . Smoking status: Current Every Day Smoker    Packs/day: 0.50  . Smokeless tobacco: Never Used  Substance Use Topics  . Alcohol use: No  . Drug use: No      Allergies   Patient has no known allergies.   Review of Systems Review of Systems  Reason unable to perform ROS: See HPI as above.     Physical Exam Triage Vital Signs ED Triage Vitals [03/15/19 1454]  Enc Vitals Group     BP (!) 153/82     Pulse Rate (!) 104     Resp 16     Temp 98.2 F (36.8 C)     Temp Source Oral     SpO2 98 %     Weight      Height      Head Circumference      Peak Flow      Pain Score 2     Pain Loc      Pain Edu?      Excl. in [place]?    No data found.  Updated Vital Signs BP (!) 153/82 (BP Location: Left Arm)   Pulse (!) 104   Temp 98.2 F (36.8 C) (Oral)   Resp 16   SpO2 98%   Physical Exam Constitutional:      General: He is not in acute distress.    Appearance: He is well-developed. He is not diaphoretic.  HENT:     Head: Normocephalic and atraumatic.  Eyes:  Conjunctiva/sclera: Conjunctivae normal.     Pupils: Pupils are equal, round, and reactive to light.  Pulmonary:     Effort: Pulmonary effort is normal. No respiratory distress.  Musculoskeletal:     Comments: Much improved cellulitis. No erythema, warmth. No tenderness to palpation.   Skin:    General: Skin is warm and dry.  Neurological:     Mental Status: He is alert and oriented to person, place, and time.      UC Treatments / Results  Labs (all labs ordered are listed, but only abnormal results are displayed) Labs Reviewed - No data to display  EKG   Radiology No results found.  Procedures Procedures (including critical care time)  Medications Ordered in UC Medications - No data to display  Initial Impression / Assessment and Plan / UC Course  I have reviewed the triage vital signs and the nursing notes.  Pertinent labs & imaging results that were available during my care of the patient were reviewed by me and considered in my medical decision making (see chart for details).    Discussed with patient can extend 2 day work note. Discussed both ED  or UC does not fill out FMLA paperwork, will need to follow up with a PCP if needing paperwork filled. Patient expresses understanding.  Final Clinical Impressions(s) / UC Diagnoses   Final diagnoses:  Cellulitis of left lower extremity   ED Prescriptions    None        [name], PA-C 03/15/19 1533

## 2019-03-15 NOTE — ED Notes (Signed)
Patient able to ambulate independently  

## 2019-03-21 ENCOUNTER — Encounter: Payer: Self-pay | Admitting: Emergency Medicine

## 2019-03-21 ENCOUNTER — Ambulatory Visit
Admission: EM | Admit: 2019-03-21 | Discharge: 2019-03-21 | Disposition: A | Discharge status: Home / Self Care | Payer: Managed Care, Other (non HMO) | Attending: Physician Assistant | Admitting: Physician Assistant

## 2019-03-21 ENCOUNTER — Other Ambulatory Visit: Payer: Self-pay

## 2019-03-21 DIAGNOSIS — Z114 Encounter for screening for human immunodeficiency virus [HIV]: Secondary | ICD-10-CM | POA: Diagnosis not present

## 2019-03-21 NOTE — Discharge Instructions (Signed)
Your results will be available on the MyChart app within 4-5 days. You will be notified of any positive results.

## 2019-03-21 NOTE — ED Triage Notes (Signed)
Pt presents to [facility] to get tested for STD.  Denies any symptoms.   Denies any known exposures.

## 2019-03-21 NOTE — ED Provider Notes (Signed)
[name]    CSN: [id number] Arrival date & time: 03/21/19  1918      History   Chief Complaint Chief Complaint  Patient presents with  . Exposure to STD    HPI Edward Downs is a 48 y.o. male.   48 yo male presents with interest in HIV testing. He has no known exposure. He is sexually active with 1 partner and uses condoms. He is not interested in testing for trich, GC, or CT as he does not want a urethral swab. He denies known exposure. He denies fever, discharge, or dysuria.      History reviewed. No pertinent past medical history.  There are no active problems to display for this patient.   Past Surgical History:  Procedure Laterality Date  . FOOT SURGERY         Home Medications    Prior to Admission medications   Medication Sig Start Date End Date Taking? Authorizing Provider  cetirizine (ZYRTEC ALLERGY) 10 MG tablet Take 1 tablet (10 mg total) by mouth 2 (two) times daily. 03/10/19  Yes Chantale Leugers V, PA-C  acetaminophen (TYLENOL) 500 MG tablet Take 1 tablet (500 mg total) by mouth every 6 (six) hours as needed for pain. 02/10/13   [name], PA-C  clindamycin (CLEOCIN) 300 MG capsule Take 1 capsule (300 mg total) by mouth 3 (three) times daily for 10 days. 03/11/19 03/21/19  [name], [name], DO  ondansetron (ZOFRAN ODT) 4 MG disintegrating tablet Take 1 tablet (4 mg total) by mouth every 8 (eight) hours as needed for nausea or vomiting. 03/09/19   [name], PA-C    Family History Family History  Problem Relation Age of Onset  . Diabetes Mother     Social History Social History   Tobacco Use  . Smoking status: Current Every Day Smoker    Packs/day: 0.50  . Smokeless tobacco: Never Used  Substance Use Topics  . Alcohol use: No  . Drug use: No     Allergies   Patient has no known allergies.   Review of Systems Review of Systems  See HPI.    Physical Exam Triage Vital Signs ED Triage Vitals [03/21/19 1931]  Enc Vitals Group      BP 127/85     Pulse Rate (!) 104     Resp 18     Temp 98.2 F (36.8 C)     Temp Source Oral     SpO2 98 %     Weight      Height      Head Circumference      Peak Flow      Pain Score 0     Pain Loc      Pain Edu?      Excl. in [place]?    No data found.  Updated Vital Signs BP 127/85 (BP Location: Left Arm)   Pulse (!) 104   Temp 98.2 F (36.8 C) (Oral)   Resp 18   SpO2 98%   Physical Exam Constitutional:      General: He is not in acute distress.    Appearance: Normal appearance. He is not ill-appearing, toxic-appearing or diaphoretic.  HENT:     Head: Normocephalic and atraumatic.  Pulmonary:     Effort: Pulmonary effort is normal. No respiratory distress.  Neurological:     Mental Status: He is alert and oriented to person, place, and time. Mental status is at baseline.  Psychiatric:  Mood and Affect: Mood normal.        Behavior: Behavior normal.      UC Treatments / Results  Labs (all labs ordered are listed, but only abnormal results are displayed) Labs Reviewed  HIV ANTIBODY (ROUTINE TESTING W REFLEX)  RPR    EKG   Radiology No results found.  Procedures Procedures (including critical care time)  Medications Ordered in UC Medications - No data to display  Initial Impression / Assessment and Plan / UC Course  I have reviewed the triage vital signs and the nursing notes.  Pertinent labs & imaging results that were available during my care of the patient were reviewed by me and considered in my medical decision making (see chart for details).   HIV and RPR testing performed per patient request. Patient without known exposure or symptoms of STDs.   Final Clinical Impressions(s) / UC Diagnoses   Final diagnoses:  Screening for HIV (human immunodeficiency virus)    ED Prescriptions    None        Arturo Morton 03/21/19 2003

## 2019-03-23 LAB — RPR: RPR Ser Ql: NONREACTIVE

## 2019-03-23 LAB — HIV ANTIBODY (ROUTINE TESTING W REFLEX): HIV Screen 4th Generation wRfx: NONREACTIVE

## 2019-04-18 ENCOUNTER — Encounter: Payer: Self-pay | Admitting: Emergency Medicine

## 2019-04-18 ENCOUNTER — Ambulatory Visit
Admission: EM | Admit: 2019-04-18 | Discharge: 2019-04-18 | Disposition: A | Discharge status: Home / Self Care | Payer: Managed Care, Other (non HMO) | Attending: Emergency Medicine | Admitting: Emergency Medicine

## 2019-04-18 ENCOUNTER — Other Ambulatory Visit: Payer: Self-pay

## 2019-04-18 DIAGNOSIS — L301 Dyshidrosis [pompholyx]: Secondary | ICD-10-CM

## 2019-04-18 MED ORDER — METHYLPREDNISOLONE SODIUM SUCC 125 MG IJ SOLR
125.0000 mg | Freq: Once | INTRAMUSCULAR | Status: AC
  Administered 2019-04-18: 15:00:00 125 mg via INTRAVENOUS

## 2019-04-18 MED ORDER — TRIAMCINOLONE ACETONIDE 0.5 % EX OINT: 1.0000 "application " | TOPICAL_OINTMENT | Freq: Two times a day (BID) | CUTANEOUS | 1 refills | Status: DC

## 2019-04-18 NOTE — Discharge Instructions (Signed)
Apply triamcinolone twice daily for the next week, then may use as needed for eczema flares. May apply regular skin lotion at bedtime & wrap with clear, plastic food wrap to help keep lotion locked in place in your sleep. Avoid hot showers, pat skin dry as your skin can continue to dry out, get more itchy if not. Return for worsening itching, blisters, fever.

## 2019-04-18 NOTE — ED Provider Notes (Signed)
[name]    CSN: [id number] Arrival date & time: 04/18/19  1414      History   Chief Complaint Chief Complaint  Patient presents with  . Foot Pain    HPI Edward Downs is a 48 y.o. male with history of eczema presenting for 2-week course of "itchy spots" on bottom of feet, wrists bilaterally.  Patient has been using oatmeal lotion and "dollar store eczema cream" without relief.  Of note, patient underwent STD screening for HIV/syphilis on 03/21/2019: Negative.  Patient denies history of bug bite, scabies.  States no one else at home has this issue.  Patient states the itching comes and goes, not worse at night.  Has been taking Benadryl at night to help with itching which is helped some.   History reviewed. No pertinent past medical history.  There are no active problems to display for this patient.   Past Surgical History:  Procedure Laterality Date  . FOOT SURGERY         Home Medications    Prior to Admission medications   Medication Sig Start Date End Date Taking? Authorizing Provider  acetaminophen (TYLENOL) 500 MG tablet Take 1 tablet (500 mg total) by mouth every 6 (six) hours as needed for pain. 02/10/13   [name], PA-C  cetirizine (ZYRTEC ALLERGY) 10 MG tablet Take 1 tablet (10 mg total) by mouth 2 (two) times daily. 03/10/19   [name], Amy V, PA-C  triamcinolone ointment (KENALOG) 0.5 % Apply 1 application topically 2 (two) times daily. 04/18/19   Hall-Potvin, [place], PA-C    Family History Family History  Problem Relation Age of Onset  . Diabetes Mother     Social History Social History   Tobacco Use  . Smoking status: Current Every Day Smoker    Packs/day: 0.50  . Smokeless tobacco: Never Used  Substance Use Topics  . Alcohol use: No  . Drug use: No     Allergies   Patient has no known allergies.   Review of Systems Review of Systems  Constitutional: Negative for fatigue and fever.  Respiratory: Negative for cough  and shortness of breath.   Cardiovascular: Negative for chest pain and palpitations.  Gastrointestinal: Negative for abdominal pain, diarrhea and vomiting.  Musculoskeletal: Negative for arthralgias and myalgias.  Skin: Positive for rash. Negative for wound.  Neurological: Negative for speech difficulty and headaches.  All other systems reviewed and are negative.    Physical Exam Triage Vital Signs ED Triage Vitals  Enc Vitals Group     BP      Pulse      Resp      Temp      Temp src      SpO2      Weight      Height      Head Circumference      Peak Flow      Pain Score      Pain Loc      Pain Edu?      Excl. in [place]?    No data found.  Updated Vital Signs BP 138/87 (BP Location: Left Arm)   Pulse 82   Temp 98 F (36.7 C) (Oral)   Resp 18   SpO2 98%   Visual Acuity Right Eye Distance:   Left Eye Distance:   Bilateral Distance:    Right Eye Near:   Left Eye Near:    Bilateral Near:     Physical Exam Constitutional:  General: He is not in acute distress. HENT:     Head: Normocephalic and atraumatic.  Eyes:     General: No scleral icterus.    Pupils: Pupils are equal, round, and reactive to light.  Cardiovascular:     Rate and Rhythm: Normal rate.  Pulmonary:     Effort: Pulmonary effort is normal.  Skin:    Coloration: Skin is not jaundiced or pale.     Comments: Scattered hyperpigmented spots on plantar aspect of feet bilaterally, ventral aspect of wrist bilaterally.  No burrowing, erythema, discharge observed.  Lesions appear to be older: No active vesicles, open wound.  Neurological:     Mental Status: He is alert and oriented to person, place, and time.      UC Treatments / Results  Labs (all labs ordered are listed, but only abnormal results are displayed) Labs Reviewed - No data to display  EKG   Radiology No results found.  Procedures Procedures (including critical care time)  Medications Ordered in UC Medications   methylPREDNISolone sodium succinate (SOLU-MEDROL) 125 mg/2 mL injection 125 mg (125 mg Intravenous Given 04/18/19 1437)    Initial Impression / Assessment and Plan / UC Course  I have reviewed the triage vital signs and the nursing notes.  Pertinent labs & imaging results that were available during my care of the patient were reviewed by me and considered in my medical decision making (see chart for details).     Dyshidrotic eczema versus scabies -we will treat for dyshidrotic eczema given history.  Patient given IM Solu-Medrol in office which he tolerated well.  Patient to try triamcinolone on lesions, continue Benadryl at night, additional conservative therapies as outlined below.  Return precautions discussed, patient verbalized understanding and is agreeable to plan. Final Clinical Impressions(s) / UC Diagnoses   Final diagnoses:  Dyshidrotic eczema     Discharge Instructions     Apply triamcinolone twice daily for the next week, then may use as needed for eczema flares. May apply regular skin lotion at bedtime & wrap with clear, plastic food wrap to help keep lotion locked in place in your sleep. Avoid hot showers, pat skin dry as your skin can continue to dry out, get more itchy if not. Return for worsening itching, blisters, fever.    ED Prescriptions    Medication Sig Dispense Auth. Provider   triamcinolone ointment (KENALOG) 0.5 % Apply 1 application topically 2 (two) times daily. 30 g Hall-Potvin, [place], PA-C     PDMP not reviewed this encounter.   Hall-Potvin, [place], [place] 04/18/19 1443

## 2019-04-18 NOTE — ED Notes (Signed)
Patient able to ambulate independently  

## 2019-04-18 NOTE — ED Triage Notes (Signed)
Pt presents to [facility] for assessment of small blisters that keep popping up with itching to his bilateral inner wrists and soles of his feet.  States he keeps popping them, and is now ending up with a ot of scarring.  Hx of eczema.

## 2019-05-13 ENCOUNTER — Other Ambulatory Visit: Payer: Self-pay

## 2019-05-13 ENCOUNTER — Encounter: Payer: Self-pay | Admitting: Emergency Medicine

## 2019-05-13 ENCOUNTER — Ambulatory Visit
Admission: EM | Admit: 2019-05-13 | Discharge: 2019-05-13 | Disposition: A | Discharge status: Home / Self Care | Payer: Managed Care, Other (non HMO) | Attending: Emergency Medicine | Admitting: Emergency Medicine

## 2019-05-13 DIAGNOSIS — L301 Dyshidrosis [pompholyx]: Secondary | ICD-10-CM

## 2019-05-13 MED ORDER — PREDNISONE 10 MG (21) PO TBPK: ORAL_TABLET | Freq: Every day | ORAL | 0 refills | Status: DC

## 2019-05-13 MED ORDER — PERMETHRIN 5 % EX CREA: TOPICAL_CREAM | CUTANEOUS | 0 refills | Status: DC

## 2019-05-13 NOTE — Discharge Instructions (Addendum)
Apply permethrin to affected areas once. Take steroid as discussed w/ breakfast (6-5-4-3-2-1).

## 2019-05-13 NOTE — ED Provider Notes (Signed)
[name]    CSN: [id number] Arrival date & time: 05/13/19  1314      History   Chief Complaint Chief Complaint  Patient presents with  . Blister    HPI Edward Downs is a 48 y.o. male with history of dyshidrotic eczema presenting for pruritic, papular rash on palms of hands, soles of feet bilaterally.  Patient was seen 04/18/19 by me for similar issue.  States that cream was helpful, Solu-Medrol injection "helped a lot".  Patient still denies that roommate has any issues with the rash, no known scabies or bug bites.  Chart review done by me at time of appointment showing negative HPI, HIV on 03/21/2019.  Denies change in sexual partner since then.   History reviewed. No pertinent past medical history.  There are no active problems to display for this patient.   Past Surgical History:  Procedure Laterality Date  . FOOT SURGERY         Home Medications    Prior to Admission medications   Medication Sig Start Date End Date Taking? Authorizing Provider  triamcinolone ointment (KENALOG) 0.5 % Apply 1 application topically 2 (two) times daily. 04/18/19  Yes Hall-Potvin, [place], PA-C  acetaminophen (TYLENOL) 500 MG tablet Take 1 tablet (500 mg total) by mouth every 6 (six) hours as needed for pain. 02/10/13   [name], PA-C  cetirizine (ZYRTEC ALLERGY) 10 MG tablet Take 1 tablet (10 mg total) by mouth 2 (two) times daily. 03/10/19   [name], Amy V, PA-C  permethrin (ELIMITE) 5 % cream Apply to affected area once 05/13/19   Hall-Potvin, [place], PA-C  predniSONE (STERAPRED UNI-PAK 21 TAB) 10 MG (21) TBPK tablet Take by mouth daily. Take steroid taper as written 05/13/19   Hall-Potvin, [place], PA-C    Family History Family History  Problem Relation Age of Onset  . Diabetes Mother     Social History Social History   Tobacco Use  . Smoking status: Current Every Day Smoker    Packs/day: 0.50  . Smokeless tobacco: Never Used  Substance Use Topics  .  Alcohol use: No  . Drug use: No     Allergies   Patient has no known allergies.   Review of Systems Review of Systems  Constitutional: Negative for fatigue and fever.  Respiratory: Negative for cough and shortness of breath.   Cardiovascular: Negative for chest pain and palpitations.  Gastrointestinal: Negative for abdominal pain, diarrhea and vomiting.  Musculoskeletal: Negative for arthralgias and myalgias.  Skin: Positive for rash. Negative for wound.  Neurological: Negative for speech difficulty and headaches.  All other systems reviewed and are negative.    Physical Exam Triage Vital Signs ED Triage Vitals  Enc Vitals Group     BP      Pulse      Resp      Temp      Temp src      SpO2      Weight      Height      Head Circumference      Peak Flow      Pain Score      Pain Loc      Pain Edu?      Excl. in [place]?    No data found.  Updated Vital Signs BP (!) 146/90   Pulse 98   Temp 98.8 F (37.1 C) (Oral)   Resp 16   SpO2 97%     Physical Exam Constitutional:  General: He is not in acute distress. HENT:     Head: Normocephalic and atraumatic.  Eyes:     General: No scleral icterus.    Pupils: Pupils are equal, round, and reactive to light.  Cardiovascular:     Rate and Rhythm: Normal rate.  Pulmonary:     Effort: Pulmonary effort is normal.  Skin:    Coloration: Skin is not jaundiced or pale.     Comments: Diffuse, scattered hyperpigmented papules on plantar aspect of feet and ventral aspect of wrist bilaterally.  No burrowing, erythema, open wound, discharge, or warmth observed.  Neurological:     Mental Status: He is alert and oriented to person, place, and time.      UC Treatments / Results  Labs (all labs ordered are listed, but only abnormal results are displayed) Labs Reviewed - No data to display  EKG   Radiology No results found.  Procedures Procedures (including critical care time)  Medications Ordered in UC  Medications - No data to display  Initial Impression / Assessment and Plan / UC Course  I have reviewed the triage vital signs and the nursing notes.  Pertinent labs & imaging results that were available during my care of the patient were reviewed by me and considered in my medical decision making (see chart for details).     We will start steroid taper for dyshidrotic eczema flare. Patient also to use permethrin once tonight to cover for possible scabies as this is now second time he has come to office for this.  Reviewed appropriate hand hydration/care w/ eczema: Patient verbalized understanding.  Return precautions discussed, patient verbalized understanding and is agreeable to plan. Final Clinical Impressions(s) / UC Diagnoses   Final diagnoses:  Dyshidrotic eczema     Discharge Instructions     Apply permethrin to affected areas once. Take steroid as discussed w/ breakfast (6-5-4-3-2-1).    ED Prescriptions    Medication Sig Dispense Auth. Provider   predniSONE (STERAPRED UNI-PAK 21 TAB) 10 MG (21) TBPK tablet Take by mouth daily. Take steroid taper as written 21 tablet Hall-Potvin, [place], PA-C   permethrin (ELIMITE) 5 % cream Apply to affected area once 60 g Hall-Potvin, [place], PA-C     PDMP not reviewed this encounter.   Hall-Potvin, [place], [place] 05/13/19 1733

## 2019-05-13 NOTE — ED Triage Notes (Signed)
PT reports small blisters on palms of hands, fingers, and soles of feet. Has been using kenalog cream and they have not improved. Areas are itchy.

## 2020-01-22 ENCOUNTER — Encounter: Payer: Self-pay | Admitting: Family Medicine

## 2020-01-22 ENCOUNTER — Other Ambulatory Visit: Payer: Self-pay

## 2020-01-22 ENCOUNTER — Ambulatory Visit (INDEPENDENT_AMBULATORY_CARE_PROVIDER_SITE_OTHER): Payer: Managed Care, Other (non HMO) | Admitting: Family Medicine

## 2020-01-22 VITALS — BP 130/82 | HR 93 | Temp 97.8°F | Ht 74.0 in | Wt 204.0 lb

## 2020-01-22 DIAGNOSIS — Z0000 Encounter for general adult medical examination without abnormal findings: Secondary | ICD-10-CM | POA: Diagnosis not present

## 2020-01-22 DIAGNOSIS — Z720 Tobacco use: Secondary | ICD-10-CM | POA: Diagnosis not present

## 2020-01-22 DIAGNOSIS — Z8709 Personal history of other diseases of the respiratory system: Secondary | ICD-10-CM | POA: Diagnosis not present

## 2020-01-22 DIAGNOSIS — Z113 Encounter for screening for infections with a predominantly sexual mode of transmission: Secondary | ICD-10-CM | POA: Insufficient documentation

## 2020-01-22 DIAGNOSIS — L918 Other hypertrophic disorders of the skin: Secondary | ICD-10-CM | POA: Diagnosis not present

## 2020-01-22 LAB — COMPREHENSIVE METABOLIC PANEL
ALT: 12 U/L (ref 0–53)
AST: 15 U/L (ref 0–37)
Albumin: 4.3 g/dL (ref 3.5–5.2)
Alkaline Phosphatase: 56 U/L (ref 39–117)
BUN: 9 mg/dL (ref 6–23)
CO2: 29 mEq/L (ref 19–32)
Calcium: 9.5 mg/dL (ref 8.4–10.5)
Chloride: 104 mEq/L (ref 96–112)
Creatinine, Ser: 0.96 mg/dL (ref 0.40–1.50)
GFR: 101.64 mL/min (ref >60.00)
Glucose, Bld: 101 mg/dL — ABNORMAL HIGH (ref 70–99)
Potassium: 4.2 mEq/L (ref 3.5–5.1)
Sodium: 140 mEq/L (ref 135–145)
Total Bilirubin: 0.4 mg/dL (ref 0.2–1.2)
Total Protein: 6.6 g/dL (ref 6.0–8.3)

## 2020-01-22 LAB — URINALYSIS, ROUTINE W REFLEX MICROSCOPIC
Bilirubin Urine: NEGATIVE
Hgb urine dipstick: NEGATIVE
Ketones, ur: NEGATIVE
Leukocytes,Ua: NEGATIVE
Nitrite: NEGATIVE
RBC / HPF: NONE SEEN (ref 0–?)
Specific Gravity, Urine: 1.01 (ref 1.000–1.030)
Total Protein, Urine: NEGATIVE
Urine Glucose: NEGATIVE
Urobilinogen, UA: 1 (ref 0.0–1.0)
WBC, UA: NONE SEEN (ref 0–?)
pH: 7 (ref 5.0–8.0)

## 2020-01-22 LAB — LDL CHOLESTEROL, DIRECT: Direct LDL: 126 mg/dL

## 2020-01-22 LAB — CBC
HCT: 39.2 % (ref 39.0–52.0)
Hemoglobin: 13 g/dL (ref 13.0–17.0)
MCHC: 33.2 g/dL (ref 30.0–36.0)
MCV: 87.1 fl (ref 78.0–100.0)
Platelets: 199 10*3/uL (ref 150.0–400.0)
RBC: 4.5 Mil/uL (ref 4.22–5.81)
RDW: 14.1 % (ref 11.5–15.5)
WBC: 10.9 10*3/uL — ABNORMAL HIGH (ref 4.0–10.5)

## 2020-01-22 LAB — LIPID PANEL
Cholesterol: 259 mg/dL — ABNORMAL HIGH (ref 0–200)
HDL: 34.4 mg/dL — ABNORMAL LOW (ref >39.00)
Total CHOL/HDL Ratio: 8
Triglycerides: 507 mg/dL — ABNORMAL HIGH (ref 0.0–149.0)

## 2020-01-22 LAB — PSA: PSA: 0.98 ng/mL (ref 0.10–4.00)

## 2020-01-22 MED ORDER — VARENICLINE TARTRATE 0.5 MG PO TABS: ORAL_TABLET | ORAL | 1 refills | Status: DC

## 2020-01-22 NOTE — Patient Instructions (Signed)
Coping with Quitting Smoking  Quitting smoking is a physical and mental challenge. You will face cravings, withdrawal symptoms, and temptation. Before quitting, work with your health care provider to make a plan that can help you cope. Preparation can help you quit and keep you from giving in. How can I cope with cravings? Cravings usually last for 5-10 minutes. If you get through it, the craving will pass. Consider taking the following actions to help you cope with cravings:  Keep your mouth busy: ? Chew sugar-free gum. ? Suck on hard candies or a straw. ? Brush your teeth.  Keep your hands and body busy: ? Immediately change to a different activity when you feel a craving. ? Squeeze or play with a ball. ? Do an activity or a hobby, like making bead jewelry, practicing needlepoint, or working with wood. ? Mix up your normal routine. ? Take a short exercise break. Go for a quick walk or run up and down stairs. ? Spend time in public places where smoking is not allowed.  Focus on doing something kind or helpful for someone else.  Call a friend or family member to talk during a craving.  Join a support group.  Call a quit line, such as 1-800-QUIT-NOW.  Talk with your health care provider about medicines that might help you cope with cravings and make quitting easier for you. How can I deal with withdrawal symptoms? Your body may experience negative effects as it tries to get used to not having nicotine in the system. These effects are called withdrawal symptoms. They may include:  Feeling hungrier than normal.  Trouble concentrating.  Irritability.  Trouble sleeping.  Feeling depressed.  Restlessness and agitation.  Craving a cigarette. To manage withdrawal symptoms:  Avoid places, people, and activities that trigger your cravings.  Remember why you want to quit.  Get plenty of sleep.  Avoid coffee and other caffeinated drinks. These may worsen some of your  symptoms. How can I handle social situations? Social situations can be difficult when you are quitting smoking, especially in the first few weeks. To manage this, you can:  Avoid parties, bars, and other social situations where people might be smoking.  Avoid alcohol.  Leave right away if you have the urge to smoke.  Explain to your family and friends that you are quitting smoking. Ask for understanding and support.  Plan activities with friends or family where smoking is not an option. What are some ways I can cope with stress? Wanting to smoke may cause stress, and stress can make you want to smoke. Find ways to manage your stress. Relaxation techniques can help. For example:  Breathe slowly and deeply, in through your nose and out through your mouth.  Listen to soothing, relaxing music.  Talk with a family member or friend about your stress.  Light a candle.  Soak in a bath or take a shower.  Think about a peaceful place. What are some ways I can prevent weight gain? Be aware that many people gain weight after they quit smoking. However, not everyone does. To keep from gaining weight, have a plan in place before you quit and stick to the plan after you quit. Your plan should include:  Having healthy snacks. When you have a craving, it may help to: ? Eat plain popcorn, crunchy carrots, celery, or other cut vegetables. ? Chew sugar-free gum.  Changing how you eat: ? Eat small portion sizes at meals. ? Eat 4-6 small meals  throughout the day instead of 1-2 large meals a day. ? Be mindful when you eat. Do not watch television or do other things that might distract you as you eat.  Exercising regularly: ? Make time to exercise each day. If you do not have time for a long workout, do short bouts of exercise for 5-10 minutes several times a day. ? Do some form of strengthening exercise, like weight lifting, and some form of aerobic exercise, like running or swimming.  Drinking  plenty of water or other low-calorie or no-calorie drinks. Drink 6-8 glasses of water daily, or as much as instructed by your health care provider. Summary  Quitting smoking is a physical and mental challenge. You will face cravings, withdrawal symptoms, and temptation to smoke again. Preparation can help you as you go through these challenges.  You can cope with cravings by keeping your mouth busy (such as by chewing gum), keeping your body and hands busy, and making calls to family, friends, or a helpline for people who want to quit smoking.  You can cope with withdrawal symptoms by avoiding places where people smoke, avoiding drinks with caffeine, and getting plenty of rest.  Ask your health care provider about the different ways to prevent weight gain, avoid stress, and handle social situations. This information is not intended to replace advice given to you by your health care provider. Make sure you discuss any questions you have with your health care provider. Document Revised: 06/03/2017 Document Reviewed: 06/18/2016 Elsevier Patient Education  2020 [employer], Male Adopting a healthy lifestyle and getting preventive care are important in promoting health and wellness. Ask your health care provider about:  The right schedule for you to have regular tests and exams.  Things you can do on your own to prevent diseases and keep yourself healthy. What should I know about diet, weight, and exercise? Eat a healthy diet   Eat a diet that includes plenty of vegetables, fruits, low-fat dairy products, and lean protein.  Do not eat a lot of foods that are high in solid fats, added sugars, or sodium. Maintain a healthy weight Body mass index (BMI) is a measurement that can be used to identify possible weight problems. It estimates body fat based on height and weight. Your health care provider can help determine your BMI and help you achieve or maintain a healthy  weight. Get regular exercise Get regular exercise. This is one of the most important things you can do for your health. Most adults should:  Exercise for at least 150 minutes each week. The exercise should increase your heart rate and make you sweat (moderate-intensity exercise).  Do strengthening exercises at least twice a week. This is in addition to the moderate-intensity exercise.  Spend less time sitting. Even light physical activity can be beneficial. Watch cholesterol and blood lipids Have your blood tested for lipids and cholesterol at 47 years of age, then have this test every 5 years. You may need to have your cholesterol levels checked more often if:  Your lipid or cholesterol levels are high.  You are older than 47 years of age.  You are at high risk for heart disease. What should I know about cancer screening? Many types of cancers can be detected early and may often be prevented. Depending on your health history and family history, you may need to have cancer screening at various ages. This may include screening for:  Colorectal cancer.  Prostate cancer.  Skin  cancer.  Lung cancer. What should I know about heart disease, diabetes, and high blood pressure? Blood pressure and heart disease  High blood pressure causes heart disease and increases the risk of stroke. This is more likely to develop in people who have high blood pressure readings, are of African descent, or are overweight.  Talk with your health care provider about your target blood pressure readings.  Have your blood pressure checked: ? Every 3-5 years if you are 48-60 years of age. ? Every year if you are 73 years old or older.  If you are between the ages of 31 and 57 and are a current or former smoker, ask your health care provider if you should have a one-time screening for abdominal aortic aneurysm (AAA). Diabetes Have regular diabetes screenings. This checks your fasting blood sugar level. Have  the screening done:  Once every three years after age 58 if you are at a normal weight and have a low risk for diabetes.  More often and at a younger age if you are overweight or have a high risk for diabetes. What should I know about preventing infection? Hepatitis B If you have a higher risk for hepatitis B, you should be screened for this virus. Talk with your health care provider to find out if you are at risk for hepatitis B infection. Hepatitis C Blood testing is recommended for:  Everyone born from [birth year] through 1963.  Anyone with known risk factors for hepatitis C. Sexually transmitted infections (STIs)  You should be screened each year for STIs, including gonorrhea and chlamydia, if: ? You are sexually active and are younger than 47 years of age. ? You are older than 47 years of age and your health care provider tells you that you are at risk for this type of infection. ? Your sexual activity has changed since you were last screened, and you are at increased risk for chlamydia or gonorrhea. Ask your health care provider if you are at risk.  Ask your health care provider about whether you are at high risk for HIV. Your health care provider may recommend a prescription medicine to help prevent HIV infection. If you choose to take medicine to prevent HIV, you should first get tested for HIV. You should then be tested every 3 months for as long as you are taking the medicine. Follow these instructions at home: Lifestyle  Do not use any products that contain nicotine or tobacco, such as cigarettes, e-cigarettes, and chewing tobacco. If you need help quitting, ask your health care provider.  Do not use street drugs.  Do not share needles.  Ask your health care provider for help if you need support or information about quitting drugs. Alcohol use  Do not drink alcohol if your health care provider tells you not to drink.  If you drink alcohol: ? Limit how much you have to 0-2  drinks a day. ? Be aware of how much alcohol is in your drink. In the [place] one drink equals one 12 oz bottle of beer (355 mL), one 5 oz glass of wine (148 mL), or one 1 oz glass of hard liquor (44 mL). General instructions  Schedule regular health, dental, and eye exams.  Stay current with your vaccines.  Tell your health care provider if: ? You often feel depressed. ? You have ever been abused or do not feel safe at home. Summary  Adopting a healthy lifestyle and getting preventive care are important in promoting health  and wellness.  Follow your health care provider's instructions about healthy diet, exercising, and getting tested or screened for diseases.  Follow your health care provider's instructions on monitoring your cholesterol and blood pressure. This information is not intended to replace advice given to you by your health care provider. Make sure you discuss any questions you have with your health care provider. Document Revised: 06/14/2018 Document Reviewed: 06/14/2018 Elsevier Patient Education  2020 [employer]  Preventive Care 32-29 Years Old, Male Preventive care refers to lifestyle choices and visits with your health care provider that can promote health and wellness. This includes:  A yearly physical exam. This is also called an annual well check.  Regular dental and eye exams.  Immunizations.  Screening for certain conditions.  Healthy lifestyle choices, such as eating a healthy diet, getting regular exercise, not using drugs or products that contain nicotine and tobacco, and limiting alcohol use. What can I expect for my preventive care visit? Physical exam Your health care provider will check:  Height and weight. These may be used to calculate body mass index (BMI), which is a measurement that tells if you are at a healthy weight.  Heart rate and blood pressure.  Your skin for abnormal spots. Counseling Your health care provider may ask you  questions about:  Alcohol, tobacco, and drug use.  Emotional well-being.  Home and relationship well-being.  Sexual activity.  Eating habits.  Work and work Statistician. What immunizations do I need?  Influenza (flu) vaccine  This is recommended every year. Tetanus, diphtheria, and pertussis (Tdap) vaccine  You may need a Td booster every 10 years. Varicella (chickenpox) vaccine  You may need this vaccine if you have not already been vaccinated. Zoster (shingles) vaccine  You may need this after age 42. Measles, mumps, and rubella (MMR) vaccine  You may need at least one dose of MMR if you were born in 1957 or later. You may also need a second dose. Pneumococcal conjugate (PCV13) vaccine  You may need this if you have certain conditions and were not previously vaccinated. Pneumococcal polysaccharide (PPSV23) vaccine  You may need one or two doses if you smoke cigarettes or if you have certain conditions. Meningococcal conjugate (MenACWY) vaccine  You may need this if you have certain conditions. Hepatitis A vaccine  You may need this if you have certain conditions or if you travel or work in places where you may be exposed to hepatitis A. Hepatitis B vaccine  You may need this if you have certain conditions or if you travel or work in places where you may be exposed to hepatitis B. Haemophilus influenzae type b (Hib) vaccine  You may need this if you have certain risk factors. Human papillomavirus (HPV) vaccine  If recommended by your health care provider, you may need three doses over 6 months. You may receive vaccines as individual doses or as more than one vaccine together in one shot (combination vaccines). Talk with your health care provider about the risks and benefits of combination vaccines. What tests do I need? Blood tests  Lipid and cholesterol levels. These may be checked every 5 years, or more frequently if you are over 36 years old.  Hepatitis C  test.  Hepatitis B test. Screening  Lung cancer screening. You may have this screening every year starting at age 9 if you have a 30-pack-year history of smoking and currently smoke or have quit within the past 15 years.  Prostate cancer screening. Recommendations will  vary depending on your family history and other risks.  Colorectal cancer screening. All adults should have this screening starting at age 76 and continuing until age 9. Your health care provider may recommend screening at age 48 if you are at increased risk. You will have tests every 1-10 years, depending on your results and the type of screening test.  Diabetes screening. This is done by checking your blood sugar (glucose) after you have not eaten for a while (fasting). You may have this done every 1-3 years.  Sexually transmitted disease (STD) testing. Follow these instructions at home: Eating and drinking  Eat a diet that includes fresh fruits and vegetables, whole grains, lean protein, and low-fat dairy products.  Take vitamin and mineral supplements as recommended by your health care provider.  Do not drink alcohol if your health care provider tells you not to drink.  If you drink alcohol: ? Limit how much you have to 0-2 drinks a day. ? Be aware of how much alcohol is in your drink. In the [place] one drink equals one 12 oz bottle of beer (355 mL), one 5 oz glass of wine (148 mL), or one 1 oz glass of hard liquor (44 mL). Lifestyle  Take daily care of your teeth and gums.  Stay active. Exercise for at least 30 minutes on 5 or more days each week.  Do not use any products that contain nicotine or tobacco, such as cigarettes, e-cigarettes, and chewing tobacco. If you need help quitting, ask your health care provider.  If you are sexually active, practice safe sex. Use a condom or other form of protection to prevent STIs (sexually transmitted infections).  Talk with your health care provider about taking a  low-dose aspirin every day starting at age 61. What's next?  Go to your health care provider once a year for a well check visit.  Ask your health care provider how often you should have your eyes and teeth checked.  Stay up to date on all vaccines. This information is not intended to replace advice given to you by your health care provider. Make sure you discuss any questions you have with your health care provider. Document Revised: 06/15/2018 Document Reviewed: 06/15/2018 Elsevier Patient Education  2020 Else[employer] Inc.  Preventive Care 43-43 Years Old, Male Preventive care refers to lifestyle choices and visits with your health care provider that can promote health and wellness. This includes:  A yearly physical exam. This is also called an annual well check.  Regular dental and eye exams.  Immunizations.  Screening for certain conditions.  Healthy lifestyle choices, such as eating a healthy diet, getting regular exercise, not using drugs or products that contain nicotine and tobacco, and limiting alcohol use. What can I expect for my preventive care visit? Physical exam Your health care provider will check:  Height and weight. These may be used to calculate body mass index (BMI), which is a measurement that tells if you are at a healthy weight.  Heart rate and blood pressure.  Your skin for abnormal spots. Counseling Your health care provider may ask you questions about:  Alcohol, tobacco, and drug use.  Emotional well-being.  Home and relationship well-being.  Sexual activity.  Eating habits.  Work and work Statistician. What immunizations do I need?  Influenza (flu) vaccine  This is recommended every year. Tetanus, diphtheria, and pertussis (Tdap) vaccine  You may need a Td booster every 10 years. Varicella (chickenpox) vaccine  You may need this vaccine  if you have not already been vaccinated. Zoster (shingles) vaccine  You may need this after age  55. Measles, mumps, and rubella (MMR) vaccine  You may need at least one dose of MMR if you were born in 1957 or later. You may also need a second dose. Pneumococcal conjugate (PCV13) vaccine  You may need this if you have certain conditions and were not previously vaccinated. Pneumococcal polysaccharide (PPSV23) vaccine  You may need one or two doses if you smoke cigarettes or if you have certain conditions. Meningococcal conjugate (MenACWY) vaccine  You may need this if you have certain conditions. Hepatitis A vaccine  You may need this if you have certain conditions or if you travel or work in places where you may be exposed to hepatitis A. Hepatitis B vaccine  You may need this if you have certain conditions or if you travel or work in places where you may be exposed to hepatitis B. Haemophilus influenzae type b (Hib) vaccine  You may need this if you have certain risk factors. Human papillomavirus (HPV) vaccine  If recommended by your health care provider, you may need three doses over 6 months. You may receive vaccines as individual doses or as more than one vaccine together in one shot (combination vaccines). Talk with your health care provider about the risks and benefits of combination vaccines. What tests do I need? Blood tests  Lipid and cholesterol levels. These may be checked every 5 years, or more frequently if you are over 82 years old.  Hepatitis C test.  Hepatitis B test. Screening  Lung cancer screening. You may have this screening every year starting at age 51 if you have a 30-pack-year history of smoking and currently smoke or have quit within the past 15 years.  Prostate cancer screening. Recommendations will vary depending on your family history and other risks.  Colorectal cancer screening. All adults should have this screening starting at age 29 and continuing until age 23. Your health care provider may recommend screening at age 58 if you are at  increased risk. You will have tests every 1-10 years, depending on your results and the type of screening test.  Diabetes screening. This is done by checking your blood sugar (glucose) after you have not eaten for a while (fasting). You may have this done every 1-3 years.  Sexually transmitted disease (STD) testing. Follow these instructions at home: Eating and drinking  Eat a diet that includes fresh fruits and vegetables, whole grains, lean protein, and low-fat dairy products.  Take vitamin and mineral supplements as recommended by your health care provider.  Do not drink alcohol if your health care provider tells you not to drink.  If you drink alcohol: ? Limit how much you have to 0-2 drinks a day. ? Be aware of how much alcohol is in your drink. In the [place] one drink equals one 12 oz bottle of beer (355 mL), one 5 oz glass of wine (148 mL), or one 1 oz glass of hard liquor (44 mL). Lifestyle  Take daily care of your teeth and gums.  Stay active. Exercise for at least 30 minutes on 5 or more days each week.  Do not use any products that contain nicotine or tobacco, such as cigarettes, e-cigarettes, and chewing tobacco. If you need help quitting, ask your health care provider.  If you are sexually active, practice safe sex. Use a condom or other form of protection to prevent STIs (sexually transmitted infections).  Talk  with your health care provider about taking a low-dose aspirin every day starting at age 52. What's next?  Go to your health care provider once a year for a well check visit.  Ask your health care provider how often you should have your eyes and teeth checked.  Stay up to date on all vaccines. This information is not intended to replace advice given to you by your health care provider. Make sure you discuss any questions you have with your health care provider. Document Revised: 06/15/2018 Document Reviewed: 06/15/2018 Elsevier Patient Education  [place]. Varenicline oral tablets What is this medicine? VARENICLINE (var e NI kleen) is used to help people quit smoking. It is used with a patient support program recommended by your physician. This medicine may be used for other purposes; ask your health care provider or pharmacist if you have questions. COMMON BRAND NAME(S): Chantix What should I tell my health care provider before I take this medicine? They need to know if you have any of these conditions:  heart disease  if you often drink alcohol  kidney disease  mental illness  on hemodialysis  seizures  history of stroke  suicidal thoughts, plans, or attempt; a previous suicide attempt by you or a family member  an unusual or allergic reaction to varenicline, other medicines, foods, dyes, or preservatives  pregnant or trying to get pregnant  breast-feeding How should I use this medicine? Take this medicine by mouth after eating. Take with a full glass of water. Follow the directions on the prescription label. Take your doses at regular intervals. Do not take your medicine more often than directed. There are 3 ways you can use this medicine to help you quit smoking; talk to your health care professional to decide which plan is right for you: 1) you can choose a quit date and start this medicine 1 week before the quit date, or, 2) you can start taking this medicine before you choose a quit date, and then pick a quit date between day 8 and 35 days of treatment, or, 3) if you are not sure that you are able or willing to quit smoking right away, start taking this medicine and slowly decrease the amount you smoke as directed by your health care professional with the goal of being cigarette-free by week 12 of treatment. Stick to your plan; ask about support groups or other ways to help you remain cigarette-free. If you are motivated to quit smoking and did not succeed during a previous attempt with this medicine for reasons  other than side effects, or if you returned to smoking after this treatment, speak with your health care professional about whether another course of this medicine may be right for you. A special MedGuide will be given to you by the pharmacist with each prescription and refill. Be sure to read this information carefully each time. Talk to your pediatrician regarding the use of this medicine in children. This medicine is not approved for use in children. Overdosage: If you think you have taken too much of this medicine contact a poison control center or emergency room at once. NOTE: This medicine is only for you. Do not share this medicine with others. What if I miss a dose? If you miss a dose, take it as soon as you can. If it is almost time for your next dose, take only that dose. Do not take double or extra doses. What may interact with this medicine?  alcohol  insulin  other medicines used to help people quit smoking  theophylline  warfarin This list may not describe all possible interactions. Give your health care provider a list of all the medicines, herbs, non-prescription drugs, or dietary supplements you use. Also tell them if you smoke, drink alcohol, or use illegal drugs. Some items may interact with your medicine. What should I watch for while using this medicine? It is okay if you do not succeed at your attempt to quit and have a cigarette. You can still continue your quit attempt and keep using this medicine as directed. Just throw away your cigarettes and get back to your quit plan. Talk to your health care provider before using other treatments to quit smoking. Using this medicine with other treatments to quit smoking may increase the risk for side effects compared to using a treatment alone. You may get drowsy or dizzy. Do not drive, use machinery, or do anything that needs mental alertness until you know how this medicine affects you. Do not stand or sit up quickly, especially  if you are an older patient. This reduces the risk of dizzy or fainting spells. Decrease the number of alcoholic beverages that you drink during treatment with this medicine until you know if this medicine affects your ability to tolerate alcohol. Some people have experienced increased drunkenness (intoxication), unusual or sometimes aggressive behavior, or no memory of things that have happened (amnesia) during treatment with this medicine. Sleepwalking can happen during treatment with this medicine, and can sometimes lead to behavior that is harmful to you, other people, or property. Stop taking this medicine and tell your doctor if you start sleepwalking or have other unusual sleep-related activity. After taking this medicine, you may get up out of bed and do an activity that you do not know you are doing. The next morning, you may have no memory of this. Activities include driving a car ("sleep-driving"), making and eating food, talking on the phone, sexual activity, and sleep-walking. Serious injuries have occurred. Stop the medicine and call your doctor right away if you find out you have done any of these activities. Do not take this medicine if you have used alcohol that evening. Do not take it if you have taken another medicine for sleep. The risk of doing these sleep-related activities is higher. Patients and their families should watch out for new or worsening depression or thoughts of suicide. Also watch out for sudden changes in feelings such as feeling anxious, agitated, panicky, irritable, hostile, aggressive, impulsive, severely restless, overly excited and hyperactive, or not being able to sleep. If this happens, call your health care professional. If you have diabetes and you quit smoking, the effects of insulin may be increased and you may need to reduce your insulin dose. Check with your doctor or health care professional about how you should adjust your insulin dose. What side effects may  I notice from receiving this medicine? Side effects that you should report to your doctor or health care professional as soon as possible:  allergic reactions like skin rash, itching or hives, swelling of the face, lips, tongue, or throat  acting aggressive, being angry or violent, or acting on dangerous impulses  breathing problems  changes in emotions or moods  chest pain or chest tightness  feeling faint or lightheaded, falls  hallucination, loss of contact with reality  mouth sores  redness, blistering, peeling or loosening of the skin, including inside the mouth  signs and symptoms of a stroke  like changes in vision; confusion; trouble speaking or understanding; severe headaches; sudden numbness or weakness of the face, arm or leg; trouble walking; dizziness; loss of balance or coordination  seizures  sleepwalking  suicidal thoughts or other mood changes Side effects that usually do not require medical attention (report to your doctor or health care professional if they continue or are bothersome):  constipation  gas  headache  nausea, vomiting  strange dreams  trouble sleeping This list may not describe all possible side effects. Call your doctor for medical advice about side effects. You may report side effects to FDA at 1-800-FDA-1088. Where should I keep my medicine? Keep out of the reach of children. Store at room temperature between 15 and 30 degrees C (59 and 86 degrees F). Throw away any unused medicine after the expiration date. NOTE: This sheet is a summary. It may not cover all possible information. If you have questions about this medicine, talk to your doctor, pharmacist, or health care provider.  2020 Elsevier/Gold Standard (2018-06-09 14:27:36)

## 2020-01-22 NOTE — Progress Notes (Signed)
New Patient Office Visit  Subjective:  Patient ID: Edward Downs, male    DOB: 09-27-1970  Age: 49 y.o. MRN: [id number]  CC:  Chief Complaint  Patient presents with  . Establish Care    New patient, CPE patient would like options on helping to stop smoking.     HPI Edward Downs presents for establishment of care, complete physical exam and a discussion for tobacco cessation.  Patient is a long-term smoker currently smoking half a pack a day.  He is interested in quitting.  He has tried multiple modalities including patches.  He is motivated to quit.  He sings in the choir denies that it is affecting his voice.  He does not drink or use illicit drugs.  In a significant relationship engaged to be married.  Father had a CVA in his late 61s.  Mom passed in her 92s and had diabetes.  History reviewed. No pertinent past medical history.  Past Surgical History:  Procedure Laterality Date  . FOOT SURGERY      Family History  Problem Relation Age of Onset  . Diabetes Mother     Social History   Socioeconomic History  . Marital status: Single    Spouse name: Not on file  . Number of children: Not on file  . Years of education: Not on file  . Highest education level: Not on file  Occupational History  . Not on file  Tobacco Use  . Smoking status: Current Every Day Smoker    Packs/day: 1.00    Types: Cigarettes  . Smokeless tobacco: Never Used  Vaping Use  . Vaping Use: Never used  Substance and Sexual Activity  . Alcohol use: No  . Drug use: No  . Sexual activity: Not on file  Other Topics Concern  . Not on file  Social History Narrative  . Not on file   Social Determinants of Health   Financial Resource Strain:   . Difficulty of Paying Living Expenses:   Food Insecurity:   . Worried About Charity fundraiser in the Last Year:   . Arboriculturist in the Last Year:   Transportation Needs:   . Film/video editor (Medical):   Marland Kitchen Lack of Transportation  (Non-Medical):   Physical Activity:   . Days of Exercise per Week:   . Minutes of Exercise per Session:   Stress:   . Feeling of Stress :   Social Connections:   . Frequency of Communication with Friends and Family:   . Frequency of Social Gatherings with Friends and Family:   . Attends Religious Services:   . Active Member of Clubs or Organizations:   . Attends Archivist Meetings:   Marland Kitchen Marital Status:   Intimate Partner Violence:   . Fear of Current or Ex-Partner:   . Emotionally Abused:   Marland Kitchen Physically Abused:   . Sexually Abused:     ROS Review of Systems  Constitutional: Negative.   HENT: Negative.   Eyes: Negative for photophobia and visual disturbance.  Respiratory: Negative.   Cardiovascular: Negative.   Gastrointestinal: Negative.   Endocrine: Negative for polyphagia and polyuria.  Genitourinary: Negative.   Musculoskeletal: Negative.   Allergic/Immunologic: Negative for food allergies and immunocompromised state.  Neurological: Negative for speech difficulty and light-headedness.  Hematological: Negative.    Depression screen [facility] 2/9 01/22/2020 01/22/2020  Decreased Interest 0 0  Down, Depressed, Hopeless 0 0  PHQ - 2 Score 0 0  Altered sleeping 0 -  Tired, decreased energy 1 -  Change in appetite 0 -  Feeling bad or failure about yourself  0 -  Trouble concentrating 0 -  Moving slowly or fidgety/restless 0 -  Suicidal thoughts 0 -  PHQ-9 Score 1 -  Difficult doing work/chores Not difficult at all -    Objective:   Today's Vitals: BP 130/82   Pulse 93   Temp 97.8 F (36.6 C) (Tympanic)   Ht 6\' 2"  (1.88 m)   Wt 204 lb (92.5 kg)   SpO2 97%   BMI 26.19 kg/m   Physical Exam Vitals and nursing note reviewed.  Constitutional:      General: He is not in acute distress.    Appearance: Normal appearance. He is normal weight. He is not ill-appearing, toxic-appearing or diaphoretic.  HENT:     Head: Normocephalic and atraumatic.     Right Ear:  Tympanic membrane, ear canal and external ear normal.     Left Ear: Tympanic membrane, ear canal and external ear normal.  Eyes:     General:        Right eye: No discharge.        Left eye: No discharge.     Conjunctiva/sclera: Conjunctivae normal.     Pupils: Pupils are equal, round, and reactive to light.  Cardiovascular:     Rate and Rhythm: Normal rate and regular rhythm.  Pulmonary:     Effort: Pulmonary effort is normal.     Breath sounds: Normal breath sounds.  Abdominal:     General: Abdomen is flat. Bowel sounds are normal. There is no distension.     Palpations: Abdomen is soft. There is no mass.     Tenderness: There is no abdominal tenderness. There is no guarding or rebound.     Hernia: No hernia is present. There is no hernia in the left inguinal area or right inguinal area.  Genitourinary:    Penis: Uncircumcised. No phimosis, paraphimosis, hypospadias, erythema, tenderness or discharge.      Testes:        Right: Mass, tenderness or swelling not present. Right testis is descended.        Left: Mass, tenderness or swelling not present. Left testis is descended.     Epididymis:     Right: Not inflamed or enlarged.     Left: Not inflamed or enlarged.    Musculoskeletal:     Cervical back: Normal range of motion. No rigidity or tenderness.     Right lower leg: No edema.     Left lower leg: No edema.  Lymphadenopathy:     Cervical: No cervical adenopathy.     Lower Body: No right inguinal adenopathy. No left inguinal adenopathy.  Skin:    General: Skin is warm and dry.  Neurological:     Mental Status: He is alert and oriented to person, place, and time.  Psychiatric:        Mood and Affect: Mood normal.        Behavior: Behavior normal.     Assessment & Plan:   Problem List Items Addressed This Visit      Musculoskeletal and Integument   Achrochordon     Other   Tobacco use - Primary   Relevant Medications   varenicline (CHANTIX) 0.5 MG tablet    History of asthma   Healthcare maintenance   Relevant Orders   CBC   Comprehensive metabolic panel   LDL cholesterol, direct  Lipid panel   PSA   Urinalysis, Routine w reflex microscopic      Outpatient Encounter Medications as of 01/22/2020  Medication Sig  . permethrin (ELIMITE) 5 % cream Apply to affected area once  . SKYRIZI, 150 MG DOSE, 75 MG/0.83ML PSKT Inject into the skin.  Marland Kitchen acetaminophen (TYLENOL) 500 MG tablet Take 1 tablet (500 mg total) by mouth every 6 (six) hours as needed for pain. (Patient not taking: Reported on 01/22/2020)  . cetirizine (ZYRTEC ALLERGY) 10 MG tablet Take 1 tablet (10 mg total) by mouth 2 (two) times daily. (Patient not taking: Reported on 01/22/2020)  . predniSONE (STERAPRED UNI-PAK 21 TAB) 10 MG (21) TBPK tablet Take by mouth daily. Take steroid taper as written (Patient not taking: Reported on 01/22/2020)  . triamcinolone ointment (KENALOG) 0.5 % Apply 1 application topically 2 (two) times daily. (Patient not taking: Reported on 01/22/2020)  . varenicline (CHANTIX) 0.5 MG tablet Pick a quit date and start medicine 7 days prior to quit date. Take one daily for 7 days and then take one twice daily.   No facility-administered encounter medications on file as of 01/22/2020.    Follow-up: Return in about 1 month (around 02/22/2020).   Given information on health maintenance disease prevention and coping with smoking.  Patient would like to try Chantix.  We discussed side effects including depression and homicidal ideation.  With these significant side effects he will stop the medicine immediately and inform me. Asked to let me know if any of the fleshy tags on penis grow.   Libby Maw, MD

## 2020-02-13 ENCOUNTER — Other Ambulatory Visit: Payer: Self-pay | Admitting: Family Medicine

## 2020-02-13 DIAGNOSIS — Z720 Tobacco use: Secondary | ICD-10-CM

## 2020-02-27 ENCOUNTER — Ambulatory Visit: Payer: Managed Care, Other (non HMO) | Admitting: Family Medicine

## 2020-03-03 ENCOUNTER — Other Ambulatory Visit: Payer: Self-pay

## 2020-03-03 ENCOUNTER — Ambulatory Visit: Payer: Managed Care, Other (non HMO) | Admitting: Family Medicine

## 2020-03-04 ENCOUNTER — Encounter: Payer: Self-pay | Admitting: Family Medicine

## 2020-03-04 ENCOUNTER — Ambulatory Visit (INDEPENDENT_AMBULATORY_CARE_PROVIDER_SITE_OTHER): Payer: Managed Care, Other (non HMO) | Admitting: Family Medicine

## 2020-03-04 VITALS — BP 118/72 | HR 108 | Temp 97.7°F | Ht 74.0 in | Wt 200.0 lb

## 2020-03-04 DIAGNOSIS — Z720 Tobacco use: Secondary | ICD-10-CM | POA: Diagnosis not present

## 2020-03-04 DIAGNOSIS — E782 Mixed hyperlipidemia: Secondary | ICD-10-CM | POA: Diagnosis not present

## 2020-03-04 MED ORDER — VARENICLINE TARTRATE 1 MG PO TABS: 1.0000 mg | ORAL_TABLET | Freq: Two times a day (BID) | ORAL | 2 refills | Status: DC

## 2020-03-04 NOTE — Progress Notes (Signed)
Established Patient Office Visit  Subjective:  Patient ID: Edward Downs, male    DOB: 07/14/1970  Age: 49 y.o. MRN: [id number]  CC:  Chief Complaint  Patient presents with   Follow-up    1 month follow up no concerns.    HPI Edward Downs presents for follow-up of his smoking cessation effort with varenicline.  He is tolerating the drug well.  Denies anger or depression with the medication.  He is down to approximately a half a pack daily.  He would like to continue it.  He cannot recall whether or not he was fasting with his original blood work.  He is uncertain as to whether or not there are other family members with high blood fats.   No past medical history on file.  Past Surgical History:  Procedure Laterality Date   FOOT SURGERY      Family History  Problem Relation Age of Onset   Diabetes Mother     Social History   Socioeconomic History   Marital status: Single    Spouse name: Not on file   Number of children: Not on file   Years of education: Not on file   Highest education level: Not on file  Occupational History   Not on file  Tobacco Use   Smoking status: Current Every Day Smoker    Packs/day: 1.00    Types: Cigarettes   Smokeless tobacco: Never Used  Vaping Use   Vaping Use: Never used  Substance and Sexual Activity   Alcohol use: No   Drug use: No   Sexual activity: Not on file  Other Topics Concern   Not on file  Social History Narrative   Not on file   Social Determinants of Health   Financial Resource Strain:    Difficulty of Paying Living Expenses: Not on file  Food Insecurity:    Worried About [place] in the Last Year: Not on file   Ran Out of Food in the Last Year: Not on file  Transportation Needs:    Lack of Transportation (Medical): Not on file   Lack of Transportation (Non-Medical): Not on file  Physical Activity:    Days of Exercise per Week: Not on file   Minutes of Exercise per  Session: Not on file  Stress:    Feeling of Stress : Not on file  Social Connections:    Frequency of Communication with Friends and Family: Not on file   Frequency of Social Gatherings with Friends and Family: Not on file   Attends Religious Services: Not on file   Active Member of Clubs or Organizations: Not on file   Attends Archivist Meetings: Not on file   Marital Status: Not on file  Intimate Partner Violence:    Fear of Current or Ex-Partner: Not on file   Emotionally Abused: Not on file   Physically Abused: Not on file   Sexually Abused: Not on file    Outpatient Medications Prior to Visit  Medication Sig Dispense Refill   permethrin (ELIMITE) 5 % cream Apply to affected area once 60 g 0   SKYRIZI, 150 MG DOSE, 75 MG/0.83ML PSKT Inject into the skin.     varenicline (CHANTIX) 0.5 MG tablet Pick a quit date and start medicine 7 days prior to quit date. Take one daily for 7 days and then take one twice daily. 60 tablet 1   acetaminophen (TYLENOL) 500 MG tablet Take 1 tablet (500  mg total) by mouth every 6 (six) hours as needed for pain. (Patient not taking: Reported on 01/22/2020) 30 tablet 0   cetirizine (ZYRTEC ALLERGY) 10 MG tablet Take 1 tablet (10 mg total) by mouth 2 (two) times daily. (Patient not taking: Reported on 01/22/2020) 20 tablet 0   predniSONE (STERAPRED UNI-PAK 21 TAB) 10 MG (21) TBPK tablet Take by mouth daily. Take steroid taper as written (Patient not taking: Reported on 01/22/2020) 21 tablet 0   triamcinolone ointment (KENALOG) 0.5 % Apply 1 application topically 2 (two) times daily. (Patient not taking: Reported on 01/22/2020) 30 g 1   No facility-administered medications prior to visit.    No Known Allergies  ROS Review of Systems  Constitutional: Negative.   Respiratory: Negative.   Cardiovascular: Negative.   Gastrointestinal: Negative.   Psychiatric/Behavioral: Negative.  Negative for behavioral problems and dysphoric  mood. The patient is not nervous/anxious.       Objective:    Physical Exam Vitals and nursing note reviewed.  Constitutional:      General: He is not in acute distress.    Appearance: Normal appearance. He is not ill-appearing, toxic-appearing or diaphoretic.  HENT:     Head: Normocephalic and atraumatic.  Eyes:     General: No scleral icterus.       Right eye: No discharge.        Left eye: No discharge.     Conjunctiva/sclera: Conjunctivae normal.  Pulmonary:     Effort: Pulmonary effort is normal.  Skin:    General: Skin is warm and dry.  Neurological:     Mental Status: He is alert and oriented to person, place, and time.  Psychiatric:        Mood and Affect: Mood normal.        Behavior: Behavior normal.     BP 118/72    Pulse (!) 108    Temp 97.7 F (36.5 C) (Tympanic)    Ht 6\' 2"  (1.88 m)    Wt 200 lb (90.7 kg)    SpO2 98%    BMI 25.68 kg/m  Wt Readings from Last 3 Encounters:  03/04/20 200 lb (90.7 kg)  01/22/20 204 lb (92.5 kg)  03/11/19 190 lb (86.2 kg)     Health Maintenance Due  Topic Date Due   Hepatitis C Screening  Never done   COVID-19 Vaccine (1) Never done   INFLUENZA VACCINE  02/03/2020    There are no preventive care reminders to display for this patient.  No results found for: TSH Lab Results  Component Value Date   WBC 10.9 (H) 01/22/2020   HGB 13.0 01/22/2020   HCT 39.2 01/22/2020   MCV 87.1 01/22/2020   PLT 199.0 01/22/2020   Lab Results  Component Value Date   NA 140 01/22/2020   K 4.2 01/22/2020   CO2 29 01/22/2020   GLUCOSE 101 (H) 01/22/2020   BUN 9 01/22/2020   CREATININE 0.96 01/22/2020   BILITOT 0.4 01/22/2020   ALKPHOS 56 01/22/2020   AST 15 01/22/2020   ALT 12 01/22/2020   PROT 6.6 01/22/2020   ALBUMIN 4.3 01/22/2020   CALCIUM 9.5 01/22/2020   GFR 101.64 01/22/2020   Lab Results  Component Value Date   CHOL 259 (H) 01/22/2020   Lab Results  Component Value Date   HDL 34.40 (L) 01/22/2020   No  results found for: [facility] Lab Results  Component Value Date   TRIG (H) 01/22/2020    507.0 Triglyceride  is over 400; calculations on Lipids are invalid.   Lab Results  Component Value Date   CHOLHDL 8 01/22/2020   No results found for: HGBA1C    Assessment & Plan:   Problem List Items Addressed This Visit      Other   Tobacco use - Primary   Relevant Medications   varenicline (CHANTIX CONTINUING MONTH PAK) 1 MG tablet   Elevated triglycerides with high cholesterol   Relevant Orders   Lipid panel      Meds ordered this encounter  Medications   varenicline (CHANTIX CONTINUING MONTH PAK) 1 MG tablet    Sig: Take 1 tablet (1 mg total) by mouth 2 (two) times daily.    Dispense:  60 tablet    Refill:  2    Follow-up: Return in about 3 months (around 06/03/2020), or return fasting for repeat lipid profile on friday..  Will continue Chantix for another 2 to 3 months and hopefully allow him to discontinue smoking altogether.  Recheck triglycerides with certain fasting lipid profile.  Libby Maw, MD

## 2020-03-04 NOTE — Patient Instructions (Signed)
Steps to Quit Smoking Smoking tobacco is the leading cause of preventable death. It can affect almost every organ in the body. Smoking puts you and those around you at risk for developing many serious chronic diseases. Quitting smoking can be difficult, but it is one of the best things that you can do for your health. It is never too late to quit. How do I get ready to quit? When you decide to quit smoking, create a plan to help you succeed. Before you quit:  Pick a date to quit. Set a date within the next 2 weeks to give you time to prepare.  Write down the reasons why you are quitting. Keep this list in places where you will see it often.  Tell your family, friends, and co-workers that you are quitting. Support from your loved ones can make quitting easier.  Talk with your health care provider about your options for quitting smoking.  Find out what treatment options are covered by your health insurance.  Identify people, places, things, and activities that make you want to smoke (triggers). Avoid them. What first steps can I take to quit smoking?  Throw away all cigarettes at home, at work, and in your car.  Throw away smoking accessories, such as ashtrays and lighters.  Clean your car. Make sure to empty the ashtray.  Clean your home, including curtains and carpets. What strategies can I use to quit smoking? Talk with your health care provider about combining strategies, such as taking medicines while you are also receiving in-person counseling. Using these two strategies together makes you more likely to succeed in quitting than if you used either strategy on its own.  If you are pregnant or breastfeeding, talk with your health care provider about finding counseling or other support strategies to quit smoking. Do not take medicine to help you quit smoking unless your health care provider tells you to do so. To quit smoking: Quit right away  Quit smoking completely, instead of  gradually reducing how much you smoke over a period of time. Research shows that stopping smoking right away is more successful than gradually quitting.  Attend in-person counseling to help you build problem-solving skills. You are more likely to succeed in quitting if you attend counseling sessions regularly. Even short sessions of 10 minutes can be effective. Take medicine You may take medicines to help you quit smoking. Some medicines require a prescription and some you can purchase over-the-counter. Medicines may have nicotine in them to replace the nicotine in cigarettes. Medicines may:  Help to stop cravings.  Help to relieve withdrawal symptoms. Your health care provider may recommend:  Nicotine patches, gum, or lozenges.  Nicotine inhalers or sprays.  Non-nicotine medicine that is taken by mouth. Find resources Find resources and support systems that can help you to quit smoking and remain smoke-free after you quit. These resources are most helpful when you use them often. They include:  Online chats with a counselor.  Telephone quitlines.  Printed self-help materials.  Support groups or group counseling.  Text messaging programs.  Mobile phone apps or applications. Use apps that can help you stick to your quit plan by providing reminders, tips, and encouragement. There are many free apps for mobile devices as well as websites. Examples include Quit Guide from the CDC and smokefree.gov What things can I do to make it easier to quit?   Reach out to your family and friends for support and encouragement. Call telephone quitlines (1-800-QUIT-NOW), reach   out to support groups, or work with a Social worker for support.  Ask people who smoke to avoid smoking around you.  Avoid places that trigger you to smoke, such as bars, parties, or smoke-break areas at work.  Spend time with people who do not smoke.  Lessen the stress in your life. Stress can be a smoking trigger for some  people. To lessen stress, try: ? Exercising regularly. ? Doing deep-breathing exercises. ? Doing yoga. ? Meditating. ? Performing a body scan. This involves closing your eyes, scanning your body from head to toe, and noticing which parts of your body are particularly tense. Try to relax the muscles in those areas. How will I feel when I quit smoking? Day 1 to 3 weeks Within the first 24 hours of quitting smoking, you may start to feel withdrawal symptoms. These symptoms are usually most noticeable 2-3 days after quitting, but they usually do not last for more than 2-3 weeks. You may experience these symptoms:  Mood swings.  Restlessness, anxiety, or irritability.  Trouble concentrating.  Dizziness.  Strong cravings for sugary foods and nicotine.  Mild weight gain.  Constipation.  Nausea.  Coughing or a sore throat.  Changes in how the medicines that you take for unrelated issues work in your body.  Depression.  Trouble sleeping (insomnia). Week 3 and afterward After the first 2-3 weeks of quitting, you may start to notice more positive results, such as:  Improved sense of smell and taste.  Decreased coughing and sore throat.  Slower heart rate.  Lower blood pressure.  Clearer skin.  The ability to breathe more easily.  Fewer sick days. Quitting smoking can be very challenging. Do not get discouraged if you are not successful the first time. Some people need to make many attempts to quit before they achieve long-term success. Do your best to stick to your quit plan, and talk with your health care provider if you have any questions or concerns. Summary  Smoking tobacco is the leading cause of preventable death. Quitting smoking is one of the best things that you can do for your health.  When you decide to quit smoking, create a plan to help you succeed.  Quit smoking right away, not slowly over a period of time.  When you start quitting, seek help from your  health care provider, family, or friends. This information is not intended to replace advice given to you by your health care provider. Make sure you discuss any questions you have with your health care provider. Document Revised: 03/16/2019 Document Reviewed: 09/09/2018 Elsevier Patient Education  [place].  Dyslipidemia Dyslipidemia is an imbalance of waxy, fat-like substances (lipids) in the blood. The body needs lipids in small amounts. Dyslipidemia often involves a high level of cholesterol or triglycerides, which are types of lipids. Common forms of dyslipidemia include:  High levels of LDL cholesterol. LDL is the type of cholesterol that causes fatty deposits (plaques) to build up in the blood vessels that carry blood away from your heart (arteries).  Low levels of HDL cholesterol. HDL cholesterol is the type of cholesterol that protects against heart disease. High levels of HDL remove the LDL buildup from arteries.  High levels of triglycerides. Triglycerides are a fatty substance in the blood that is linked to a buildup of plaques in the arteries. What are the causes? Primary dyslipidemia is caused by changes (mutations) in genes that are passed down through families (inherited). These mutations cause several types of dyslipidemia. Secondary  dyslipidemia is caused by lifestyle choices and diseases that lead to dyslipidemia, such as:  Eating a diet that is high in animal fat.  Not getting enough exercise.  Having diabetes, kidney disease, liver disease, or thyroid disease.  Drinking large amounts of alcohol.  Using certain medicines. What increases the risk? You are more likely to develop this condition if you are an older man or if you are a woman who has gone through menopause. Other risk factors include:  Having a family history of dyslipidemia.  Taking certain medicines, including birth control pills, steroids, some diuretics, and beta-blockers.  Smoking  cigarettes.  Eating a high-fat diet.  Having certain medical conditions such as diabetes, polycystic ovary syndrome (PCOS), kidney disease, liver disease, or hypothyroidism.  Not exercising regularly.  Being overweight or obese with too much belly fat. What are the signs or symptoms? In most cases, dyslipidemia does not usually cause any symptoms. In severe cases, very high lipid levels can cause:  Fatty bumps under the skin (xanthomas).  White or gray ring around the black center (pupil) of the eye. Very high triglyceride levels can cause inflammation of the pancreas (pancreatitis). How is this diagnosed? Your health care provider may diagnose dyslipidemia based on a routine blood test (fasting blood test). Because most people do not have symptoms of the condition, this blood testing (lipid profile) is done on adults age 108 and older and is repeated every 5 years. This test checks:  Total cholesterol. This measures the total amount of cholesterol in your blood, including LDL cholesterol, HDL cholesterol, and triglycerides. A healthy number is below 200.  LDL cholesterol. The target number for LDL cholesterol is different for each person, depending on individual risk factors. Ask your health care provider what your LDL cholesterol should be.  HDL cholesterol. An HDL level of 60 or higher is best because it helps to protect against heart disease. A number below 29 for men or below 29 for women increases the risk for heart disease.  Triglycerides. A healthy triglyceride number is below 150. If your lipid profile is abnormal, your health care provider may do other blood tests. How is this treated? Treatment depends on the type of dyslipidemia that you have and your other risk factors for heart disease and stroke. Your health care provider will have a target range for your lipid levels based on this information. For many people, this condition may be treated by lifestyle changes, such as  diet and exercise. Your health care provider may recommend that you:  Get regular exercise.  Make changes to your diet.  Quit smoking if you smoke. If diet changes and exercise do not help you reach your goals, your health care provider may also prescribe medicine to lower lipids. The most commonly prescribed type of medicine lowers your LDL cholesterol (statin drug). If you have a high triglyceride level, your provider may prescribe another type of drug (fibrate) or an omega-3 fish oil supplement, or both. Follow these instructions at home:  Eating and drinking  Follow instructions from your health care provider or dietitian about eating or drinking restrictions.  Eat a healthy diet as told by your health care provider. This can help you reach and maintain a healthy weight, lower your LDL cholesterol, and raise your HDL cholesterol. This may include: ? Limiting your calories, if you are overweight. ? Eating more fruits, vegetables, whole grains, fish, and lean meats. ? Limiting saturated fat, trans fat, and cholesterol.  If you drink alcohol: ?  Limit how much you use. ? Be aware of how much alcohol is in your drink. In the [place] one drink equals one 12 oz bottle of beer (355 mL), one 5 oz glass of wine (148 mL), or one 1 oz glass of hard liquor (44 mL).  Do not drink alcohol if: ? Your health care provider tells you not to drink. ? You are pregnant, may be pregnant, or are planning to become pregnant. Activity  Get regular exercise. Start an exercise and strength training program as told by your health care provider. Ask your health care provider what activities are safe for you. Your health care provider may recommend: ? 30 minutes of aerobic activity 4-6 days a week. Brisk walking is an example of aerobic activity. ? Strength training 2 days a week. General instructions  Do not use any products that contain nicotine or tobacco, such as cigarettes, e-cigarettes, and chewing  tobacco. If you need help quitting, ask your health care provider.  Take over-the-counter and prescription medicines only as told by your health care provider. This includes supplements.  Keep all follow-up visits as told by your health care provider. Contact a health care provider if:  You are: ? Having trouble sticking to your exercise or diet plan. ? Struggling to quit smoking or control your use of alcohol. Summary  Dyslipidemia often involves a high level of cholesterol or triglycerides, which are types of lipids.  Treatment depends on the type of dyslipidemia that you have and your other risk factors for heart disease and stroke.  For many people, treatment starts with lifestyle changes, such as diet and exercise.  Your health care provider may prescribe medicine to lower lipids. This information is not intended to replace advice given to you by your health care provider. Make sure you discuss any questions you have with your health care provider. Document Revised: 02/13/2018 Document Reviewed: 01/20/2018 Elsevier Patient Education  [place].

## 2020-03-05 ENCOUNTER — Encounter: Payer: Self-pay | Admitting: Family Medicine

## 2020-03-06 ENCOUNTER — Other Ambulatory Visit: Payer: Self-pay

## 2020-03-07 ENCOUNTER — Other Ambulatory Visit (INDEPENDENT_AMBULATORY_CARE_PROVIDER_SITE_OTHER): Payer: Managed Care, Other (non HMO)

## 2020-03-07 DIAGNOSIS — E782 Mixed hyperlipidemia: Secondary | ICD-10-CM | POA: Diagnosis not present

## 2020-03-07 LAB — LIPID PANEL
Cholesterol: 213 mg/dL — ABNORMAL HIGH (ref 0–200)
HDL: 39.4 mg/dL (ref >39.00)
LDL Cholesterol: 140 mg/dL — ABNORMAL HIGH (ref 0–99)
NonHDL: 173.74
Total CHOL/HDL Ratio: 5
Triglycerides: 167 mg/dL — ABNORMAL HIGH (ref 0.0–149.0)
VLDL: 33.4 mg/dL (ref 0.0–40.0)

## 2020-04-21 ENCOUNTER — Telehealth: Payer: Self-pay | Admitting: Family Medicine

## 2020-04-21 NOTE — Telephone Encounter (Signed)
Patient calling states that his pharmacy will no longer carry Chantix patient calling to see if something else could be sent in for him. Advised patient to call his insurance company to see what other medication will they cover that is equivalent to Chantix and call us back to let us see if that could be sent in for him. Patient agrees to call us back with name.

## 2020-04-21 NOTE — Telephone Encounter (Signed)
Patient states that his pharmacy has discontinued Chantix so he wants to know if he can be prescribed something else in it's place. Please call him back at [phone] and advise.

## 2020-04-23 ENCOUNTER — Telehealth: Payer: Self-pay

## 2020-04-23 NOTE — Telephone Encounter (Signed)
Spoke to [facility] and they no longer make the Chantix and the generic is ordered and should be in within 2 days.  They will notify patient.   Called patient to advised to check with them in a couple days. Dm/cma

## 2020-04-23 NOTE — Telephone Encounter (Signed)
Edward Downs, it looks as though patient has been dismissed. Please check on this for me.

## 2020-04-23 NOTE — Telephone Encounter (Signed)
[facility] sent a fax stating that patient is requesting a new RX for hte brand name Chantix due to -Generic not out yet.   Vaenciline 0.5mg  LR 03/04/20, # 60, 2 rf's LOV 03/04/20 FOV  07/10/20  Please review and advise.  Thanks.  Dm/cma

## 2020-06-05 ENCOUNTER — Ambulatory Visit: Payer: Managed Care, Other (non HMO) | Admitting: Family Medicine

## 2020-06-21 ENCOUNTER — Other Ambulatory Visit: Payer: Self-pay | Admitting: Family Medicine

## 2020-06-21 DIAGNOSIS — Z720 Tobacco use: Secondary | ICD-10-CM

## 2020-07-10 ENCOUNTER — Ambulatory Visit: Payer: Managed Care, Other (non HMO) | Admitting: Family Medicine

## 2020-07-25 ENCOUNTER — Other Ambulatory Visit: Payer: Managed Care, Other (non HMO)

## 2020-07-25 ENCOUNTER — Other Ambulatory Visit: Payer: Self-pay

## 2020-07-25 DIAGNOSIS — Z20822 Contact with and (suspected) exposure to covid-19: Secondary | ICD-10-CM

## 2020-07-27 LAB — SARS-COV-2, NAA 2 DAY TAT

## 2020-07-27 LAB — NOVEL CORONAVIRUS, NAA: SARS-CoV-2, NAA: NOT DETECTED

## 2020-08-18 ENCOUNTER — Ambulatory Visit: Payer: Managed Care, Other (non HMO) | Admitting: Family Medicine

## 2020-08-21 ENCOUNTER — Other Ambulatory Visit: Payer: Self-pay

## 2020-08-22 ENCOUNTER — Ambulatory Visit: Payer: Managed Care, Other (non HMO) | Admitting: Family Medicine

## 2020-08-22 ENCOUNTER — Ambulatory Visit (INDEPENDENT_AMBULATORY_CARE_PROVIDER_SITE_OTHER): Payer: Managed Care, Other (non HMO) | Admitting: Family Medicine

## 2020-08-22 ENCOUNTER — Encounter: Payer: Self-pay | Admitting: Family Medicine

## 2020-08-22 VITALS — BP 122/80 | HR 89 | Temp 97.5°F | Ht 74.0 in | Wt 206.8 lb

## 2020-08-22 DIAGNOSIS — A630 Anogenital (venereal) warts: Secondary | ICD-10-CM

## 2020-08-22 DIAGNOSIS — Z720 Tobacco use: Secondary | ICD-10-CM

## 2020-08-22 MED ORDER — PODOFILOX 0.5 % EX GEL: Freq: Two times a day (BID) | CUTANEOUS | 0 refills | Status: DC

## 2020-08-22 NOTE — Patient Instructions (Signed)
Genital Warts  Genital warts are a common STI (sexually transmitted infection). They may appear as small bumps on the skin of the genital and anal areas. They sometimes become irritated and cause pain. Genital warts are easily passed to other people through sexual contact. Many people do not know that they are infected, and they may be infected for years without symptoms. Even without symptoms, they can pass the infection to their sexual partners. What are the causes? This condition is caused by a virus that is called human papillomavirus (HPV). HPV is spread by having unprotected sex with an infected person. It can be spread through vaginal, anal, and oral sex. What increases the risk? You are more likely to develop this condition if:  You have unprotected sex.  You have multiple sexual partners.  You are sexually active before age 74.  You are a man who is not circumcised.  You have a male sexual partner who is not circumcised.  You have a weakened body defense system (immune system) due to disease or medicine. What are the signs or symptoms? Symptoms of this condition include:  Small growths in the genital area or anal area. These warts often grow in clusters.  Itching and irritation in the genital area or anal area.  Bleeding from the warts.  Pain during sex. How is this diagnosed? This condition is diagnosed based on your symptoms and a physical exam. You may also have other tests, including:  Biopsy. A tissue sample is removed so it can be checked under a microscope.  Colposcopy. In females, a magnifying tool is used to examine the vagina and cervix. Certain solutions may be used to make the HPV cells change color so they can be seen more easily.  A Pap test in females.  Tests for other STIs. How is this treated? This condition may be treated with:  Medicines, such as solutions or creams that are applied to your skin (topical).  Procedures, such as: ? Freezing the  warts with liquid nitrogen (cryotherapy). ? Burning the warts with a laser or electric probe (electrocautery). ? Surgery to remove the warts. Getting treatment is important because genital warts can lead to other problems. In females, the virus that causes genital warts may increase the risk for cervical cancer. Follow these instructions at home: Medicines  Apply over-the-counter and prescription medicines only as told by your health care provider.  Do not treat genital warts with medicines that are used for treating hand warts.  Talk with your health care provider about using over-the-counter anti-itch creams.   Instructions for women  Get screened regularly for cervical cancer. This type of cancer is slow growing and can almost always be cured if it is found early.  If you become pregnant, tell your health care provider that you have had an HPV infection. Your health care provider will monitor you closely during pregnancy. General instructions  Do not touch or scratch the warts.  Do not have sex until your treatment has been completed.  Tell your current and past sexual partners about your condition because they may also need treatment.  After treatment, use condoms during sex to prevent future infections.  Keep all follow-up visits as told by your health care provider. This is important. How is this prevented? Talk with your health care provider about getting the HPV vaccine. The vaccine:  Can prevent some HPV infections and cancers.  Is recommended for males and females who are 20-21 years old.  Is not recommended  for pregnant women.  Will not work if you already have HPV. Contact a health care provider if you:  Have redness, swelling, or pain in the area of the treated skin.  Have a fever.  Feel generally ill.  Feel lumps in and around your genital or anal area.  Have bleeding in your genital or anal area.  Have pain during sex or bleeding after  sex. Summary  Genital warts are a common STI (sexually transmitted infection). It may appear as small bumps on the genital and anal areas.  This condition is caused by a virus that is called human papillomavirus (HPV). HPV is spread by having unprotected sex with an infected person. It can be spread through vaginal, anal, and oral sex.  Treatment is important because genital warts can lead to other problems. In females, the virus that causes genital warts may increase the risk for cervical cancer.  This condition may be treated with medicine that is applied to the skin or procedures to remove the warts.  The HPV vaccine can prevent some HPV infections and cancers. It is recommended that the vaccine be given to males and females who are 24-67 years old. This information is not intended to replace advice given to you by your health care provider. Make sure you discuss any questions you have with your health care provider. Document Revised: 05/03/2019 Document Reviewed: 05/03/2019 Elsevier Patient Education  [place].

## 2020-08-22 NOTE — Progress Notes (Signed)
[facility] [address] [place] [address] Dept: [phone] Dept Fax: [phone]  Chronic Care Office Visit  Subjective:    Patient ID: Edward Downs, male    DOB: 1970-07-25, 50 y.o..   MRN: [id number]  Chief Complaint  Patient presents with  . Follow-up    6 month follow up on labs, patient would like skin tag removed from foreskin noticed about 9 months ago.     History of Present Illness:  Patient is in today for reassessment of his chronic medical issues. He has a history of tobacco use. He had been prescribed Chantix for this. He notes he remains on the medication. He is still smoking 1 cigarette at bedtime and 2 each morning. Part of the challenge is his SO continues to smoke, but she is considering giving this up.  Mr. Vanlanen is managed by Gloris Manchester PA-C with the [facility] related to a chronic rash on his feet. He is currently using PRN Kenalog ointment and is on Skyrizi injections. He was not able to tell me the name of the skin condition that he has.  Mr. Gesner also notes that he has a skin tag on his foreskin that he would like removed. He states he had a previously skin tag on his inner thigh that was taken off. He is currently in a monogamous relationship and his partner does not have any history of similar genital lesions.  Past Medical History: Patient Active Problem List   Diagnosis Date Noted  . Condylomata acuminata in male 08/22/2020  . Elevated triglycerides with high cholesterol 03/04/2020  . Tobacco use 01/22/2020  . History of asthma 01/22/2020  . Healthcare maintenance 01/22/2020  . 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 FOOT SURGERY     Family History  Problem Relation Age of Onset  . Diabetes Mother    Outpatient Medications Prior to Visit  Medication Sig Dispense Refill  . SKYRIZI, 150 MG DOSE, 75 MG/0.83ML PSKT  Inject into the skin.    Marland Kitchen triamcinolone ointment (KENALOG) 0.5 % Apply 1 application topically 2 (two) times daily. 30 g 1  . varenicline (CHANTIX) 1 MG tablet TAKE 1 TABLET BY MOUTH TWICE A DAY 56 tablet 4  . acetaminophen (TYLENOL) 500 MG tablet Take 1 tablet (500 mg total) by mouth every 6 (six) hours as needed for pain. (Patient not taking: No sig reported) 30 tablet 0  . cetirizine (ZYRTEC ALLERGY) 10 MG tablet Take 1 tablet (10 mg total) by mouth 2 (two) times daily. (Patient not taking: No sig reported) 20 tablet 0  . permethrin (ELIMITE) 5 % cream Apply to affected area once (Patient not taking: Reported on 08/22/2020) 60 g 0  . predniSONE (STERAPRED UNI-PAK 21 TAB) 10 MG (21) TBPK tablet Take by mouth daily. Take steroid taper as written (Patient not taking: No sig reported) 21 tablet 0   No facility-administered medications prior to visit.   No Known Allergies    Objective:   Today's Vitals   08/22/20 1346  BP: 122/80  Pulse: 89  Temp: (!) 97.5 F (36.4 C)  TempSrc: Temporal  SpO2: 98%  Weight: 206 lb 12.8 oz (93.8 kg)  Height: 6\' 2"  (1.88 m)   Body mass index is 26.55 kg/m.   General: Well developed, well nourished. No acute distress. GU: Uncircumcised male. There is a 2-3 mm pedunculated warty lesion on the foreskin. Near it is a  1-2 mm flat warty   lesion. Skin: The soles of the feet show many hyperpigmented areas. There are multiple small punctate type lesions with a  silver crust int he center on the toes and at the ball of the foot. Psych: Alert and oriented. Normal mood and affect.  Health Maintenance Due  Topic Date Due  . Hepatitis C Screening  Never done  . COLONOSCOPY (Pts 45-47yrs Insurance coverage will need to be confirmed)  Never done     Assessment & Plan:   1. Tobacco use I reinforced the recommendation to stop smoking. We discussed approaches to changing his routine at bedtime and in the morning to establish new patterns of behavior and break  habitual smoking at this time. He continues to express commitment to stopping.  2. Condylomata acuminata in male The lesions on the penis are consistent with condyloma rather than acrochordon. I discussed with him about HPV infection and genital warts. We will try a patient-applied approach with Podofilox initially. Recommend follow-up in 1 month to assess response to therapy.  - podofilox (CONDYLOX) 0.5 % gel; Apply topically 2 (two) times daily. for 3 days, then withhold for 4 days. May repeat up to 4 cycles until warts are gone.  Dispense: 3.5 g; Refill: 0  Haydee Salter, MD

## 2021-02-09 ENCOUNTER — Other Ambulatory Visit: Payer: Self-pay

## 2021-02-09 ENCOUNTER — Ambulatory Visit (INDEPENDENT_AMBULATORY_CARE_PROVIDER_SITE_OTHER): Payer: Managed Care, Other (non HMO) | Admitting: Family Medicine

## 2021-02-09 ENCOUNTER — Encounter: Payer: Self-pay | Admitting: Family Medicine

## 2021-02-09 VITALS — BP 120/72 | HR 102 | Temp 97.7°F | Ht 74.0 in | Wt 196.2 lb

## 2021-02-09 DIAGNOSIS — M5412 Radiculopathy, cervical region: Secondary | ICD-10-CM | POA: Diagnosis not present

## 2021-02-09 DIAGNOSIS — N489 Disorder of penis, unspecified: Secondary | ICD-10-CM

## 2021-02-09 MED ORDER — MELOXICAM 7.5 MG PO TABS: ORAL_TABLET | ORAL | 0 refills | Status: DC

## 2021-02-09 NOTE — Progress Notes (Signed)
Established Patient Office Visit  Subjective:  Patient ID: Edward Downs, male    DOB: [birth year]  Age: 50 y.o. MRN: [id number]  CC:  Chief Complaint  Patient presents with   Pain    C/O right arm pain that come and go sometimes tingling feeling.     HPI Edward Downs presents for evaluation of a lesion on his penis.  He had thought it was a genital wart and treated it with with Condylox.  After 4 treatments he developed an ulceration.  He has no history of herpes.  He is in a stable relationship and is partner has no history of herpes either.  For the last week or so he has had a tingling feeling medial to pends right shoulder blade moves into his arm and is associated with tingling in his fifth and fourth fingers.  He is right-hand dominant.  There is been no injury.  He is noted no weakness.  He has tried no medicines for this.  Has no history of cervical radiculopathy.  He is currently taking amoxicillin for dental infection.  No past medical history on file.  Past Surgical History:  Procedure Laterality Date   FOOT SURGERY      Family History  Problem Relation Age of Onset   Diabetes Mother     Social History   Socioeconomic History   Marital status: Single    Spouse name: Not on file   Number of children: Not on file   Years of education: Not on file   Highest education level: Not on file  Occupational History   Not on file  Tobacco Use   Smoking status: Every Day    Packs/day: 1.00    Types: Cigarettes   Smokeless tobacco: Never  Vaping Use   Vaping Use: Never used  Substance and Sexual Activity   Alcohol use: No   Drug use: No   Sexual activity: Not on file  Other Topics Concern   Not on file  Social History Narrative   Not on file   Social Determinants of Health   Financial Resource Strain: Not on file  Food Insecurity: Not on file  Transportation Needs: Not on file  Physical Activity: Not on file  Stress: Not on file  Social  Connections: Not on file  Intimate Partner Violence: Not on file    Outpatient Medications Prior to Visit  Medication Sig Dispense Refill   podofilox (CONDYLOX) 0.5 % gel Apply topically 2 (two) times daily. for 3 days, then withhold for 4 days. May repeat up to 4 cycles until warts are gone. 3.5 g 0   SKYRIZI, 150 MG DOSE, 75 MG/0.83ML PSKT Inject into the skin.     triamcinolone ointment (KENALOG) 0.5 % Apply 1 application topically 2 (two) times daily. 30 g 1   varenicline (CHANTIX) 1 MG tablet TAKE 1 TABLET BY MOUTH TWICE A DAY (Patient not taking: Reported on 02/09/2021) 56 tablet 4   No facility-administered medications prior to visit.    No Known Allergies  ROS Review of Systems  Constitutional: Negative.   HENT: Negative.    Eyes:  Negative for photophobia and visual disturbance.  Genitourinary:  Positive for genital sores. Negative for difficulty urinating, dysuria, hematuria, penile discharge and urgency.  Musculoskeletal:  Negative for neck pain and neck stiffness.  Skin:  Positive for rash.  Neurological:  Positive for numbness. Negative for weakness.  Psychiatric/Behavioral: Negative.       Objective:  Physical Exam Vitals and nursing note reviewed.  Constitutional:      Appearance: Normal appearance.  HENT:     Head: Normocephalic and atraumatic.     Right Ear: External ear normal.     Left Ear: External ear normal.  Eyes:     General: No scleral icterus.       Right eye: No discharge.        Left eye: No discharge.     Conjunctiva/sclera: Conjunctivae normal.  Pulmonary:     Effort: Pulmonary effort is normal.  Musculoskeletal:     Right shoulder: Normal.     Left shoulder: Normal.     Cervical back: No spasms, tenderness or bony tenderness. Normal range of motion.  Skin:    General: Skin is warm and dry.  Neurological:     Mental Status: He is alert and oriented to person, place, and time.     Motor: No weakness, atrophy or abnormal muscle tone.      Deep Tendon Reflexes:     Reflex Scores:      Tricep reflexes are 1+ on the right side and 1+ on the left side.      Bicep reflexes are 1+ on the right side and 1+ on the left side.      Brachioradialis reflexes are 1+ on the right side and 1+ on the left side.    Comments: Positive Spurling's test to right hand to left.  Psychiatric:        Mood and Affect: Mood normal.        Behavior: Behavior normal.    BP 120/72 (BP Location: Right Arm, Patient Position: Sitting, Cuff Size: Normal)   Pulse (!) 102   Temp 97.7 F (36.5 C) (Temporal)   Ht '6\' 2"'$  (1.88 m)   Wt 196 lb 3.2 oz (89 kg)   SpO2 97%   BMI 25.19 kg/m  Wt Readings from Last 3 Encounters:  02/09/21 196 lb 3.2 oz (89 kg)  08/22/20 206 lb 12.8 oz (93.8 kg)  03/04/20 200 lb (90.7 kg)     Health Maintenance Due  Topic Date Due   Pneumococcal Vaccine 9-41 Years old (1 - PCV) Never done   Hepatitis C Screening  Never done   COLONOSCOPY (Pts 45-42yr Insurance coverage will need to be confirmed)  Never done   INFLUENZA VACCINE  02/02/2021    There are no preventive care reminders to display for this patient.  No results found for: TSH Lab Results  Component Value Date   WBC 10.9 (H) 01/22/2020   HGB 13.0 01/22/2020   HCT 39.2 01/22/2020   MCV 87.1 01/22/2020   PLT 199.0 01/22/2020   Lab Results  Component Value Date   NA 140 01/22/2020   K 4.2 01/22/2020   CO2 29 01/22/2020   GLUCOSE 101 (H) 01/22/2020   BUN 9 01/22/2020   CREATININE 0.96 01/22/2020   BILITOT 0.4 01/22/2020   ALKPHOS 56 01/22/2020   AST 15 01/22/2020   ALT 12 01/22/2020   PROT 6.6 01/22/2020   ALBUMIN 4.3 01/22/2020   CALCIUM 9.5 01/22/2020   GFR 101.64 01/22/2020   Lab Results  Component Value Date   CHOL 213 (H) 03/07/2020   Lab Results  Component Value Date   HDL 39.40 03/07/2020   Lab Results  Component Value Date   LDLCALC 140 (H) 03/07/2020   Lab Results  Component Value Date   TRIG 167.0 (H) 03/07/2020   Lab  Results  Component Value Date   CHOLHDL 5 03/07/2020   No results found for: HGBA1C    Assessment & Plan:   Problem List Items Addressed This Visit       Nervous and Auditory   Cervical radiculopathy at C8   Relevant Medications   meloxicam (MOBIC) 7.5 MG tablet     Other   Penile lesion - Primary   Relevant Orders   Herpes simplex virus culture    Meds ordered this encounter  Medications   meloxicam (MOBIC) 7.5 MG tablet    Sig: Take one daily for 10  days and then as needed.    Dispense:  30 tablet    Refill:  0    Follow-up: Return if symptoms worsen or fail to improve.  Suspect that his herpes culture will be negative when I told him so.  Explained that genital warts could be quiescent in his sexual partner and resurface quietly.  Given Mobic and neck exercises to do.  He will follow-up if his neck does not improve on its own.  Libby Maw, MD

## 2021-02-11 LAB — HERPES SIMPLEX VIRUS CULTURE
MICRO NUMBER:: 12213768
SPECIMEN QUALITY:: ADEQUATE

## 2021-11-26 ENCOUNTER — Encounter: Payer: Self-pay | Admitting: Family Medicine

## 2021-11-26 ENCOUNTER — Ambulatory Visit (INDEPENDENT_AMBULATORY_CARE_PROVIDER_SITE_OTHER): Payer: Managed Care, Other (non HMO) | Admitting: Family Medicine

## 2021-11-26 VITALS — BP 138/74 | HR 71 | Temp 97.6°F | Ht 74.0 in | Wt 193.0 lb

## 2021-11-26 DIAGNOSIS — Z0000 Encounter for general adult medical examination without abnormal findings: Secondary | ICD-10-CM

## 2021-11-26 DIAGNOSIS — L403 Pustulosis palmaris et plantaris: Secondary | ICD-10-CM | POA: Diagnosis not present

## 2021-11-26 DIAGNOSIS — Z1211 Encounter for screening for malignant neoplasm of colon: Secondary | ICD-10-CM

## 2021-11-26 DIAGNOSIS — L84 Corns and callosities: Secondary | ICD-10-CM | POA: Diagnosis not present

## 2021-11-26 DIAGNOSIS — Z720 Tobacco use: Secondary | ICD-10-CM | POA: Diagnosis not present

## 2021-11-26 MED ORDER — VARENICLINE TARTRATE 0.5 MG X 11 & 1 MG X 42 PO TBPK: ORAL_TABLET | ORAL | 0 refills | Status: DC

## 2021-11-26 NOTE — Progress Notes (Signed)
Established Patient Office Visit  Subjective   Patient ID: Edward Downs, male    DOB: May 26, 1971  Age: 51 y.o. MRN: [id number]  Chief Complaint  Patient presents with   Annual Exam    CPE, no concerns. Patient fasting. Would like something to help with smoking.     HPI presents for a physical exam and help with smoking cessation.  Chantix has helped in the past.  He never experienced depression bad dreams or anger.  Things are stressful at home.  He and his wife are in counseling right now.  Smoking has increased up to a pack a day.  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  No regular exercise.  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  He also has calluses on his feet . he has no regular dental care.    Review of Systems  Constitutional: Negative.   HENT: Negative.    Eyes:  Negative for blurred vision, discharge and redness.  Respiratory: Negative.    Cardiovascular: Negative.   Gastrointestinal:  Negative for abdominal pain.  Genitourinary: Negative.   Musculoskeletal: Negative.  Negative for myalgias.  Skin:  Negative for rash.  Neurological:  Negative for tingling, loss of consciousness and weakness.  Endo/Heme/Allergies:  Negative for polydipsia.     11/26/2021    4:00 PM 02/09/2021    3:06 PM 08/22/2020    1:46 PM  Depression screen PHQ 2/9  Decreased Interest 0 0 0  Down, Depressed, Hopeless 1 0 0  PHQ - 2 Score 1 0 0       Objective:     BP 138/74 (BP Location: Right Arm, Patient Position: Sitting, Cuff Size: Normal)   Pulse 71   Temp 97.6 F (36.4 C) (Temporal)   Ht '6\' 2"'$  (1.88 m)   Wt 193 lb (87.5 kg)   SpO2 98%   BMI 24.78 kg/m    Physical Exam Constitutional:      General: He is not in acute distress.    Appearance: Normal appearance. He is not ill-appearing, toxic-appearing or diaphoretic.  HENT:     Head: Normocephalic and atraumatic.      Right Ear: External ear normal.     Left Ear: External ear normal.     Mouth/Throat:     Mouth: Mucous membranes are moist.     Pharynx: Oropharynx is clear. No oropharyngeal exudate or posterior oropharyngeal erythema.  Eyes:     General: No scleral icterus.       Right eye: No discharge.        Left eye: No discharge.     Extraocular Movements: Extraocular movements intact.     Conjunctiva/sclera: Conjunctivae normal.     Pupils: Pupils are equal, round, and reactive to light.  Cardiovascular:     Rate and Rhythm: Normal rate and regular rhythm.  Pulmonary:     Effort: Pulmonary effort is normal. No respiratory distress.     Breath sounds: Normal breath sounds.  Abdominal:     General: Bowel sounds are normal.     Tenderness: There is no abdominal tenderness. There is no guarding.  Musculoskeletal:     Cervical back: No rigidity or tenderness.  Skin:    General: Skin is warm and dry.  Neurological:     Mental Status: He is alert and oriented to person, place, and time.  Psychiatric:  Mood and Affect: Mood normal.        Behavior: Behavior normal.     No results found for any visits on 11/26/21.    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  Restart Chantix.   Assuming that he will do well we will call in second month pack.  Follow-up in 8 weeks.  Podiatry referral for help with callus formation.  Follow-up with dermatology for pustular psoriasis.  He will schedule dental care.  Information on health maintenance and disease prevention was given.  Routine fasting blood work  Libby Maw, MD

## 2021-11-27 LAB — COMPREHENSIVE METABOLIC PANEL
ALT: 15 U/L (ref 0–53)
AST: 18 U/L (ref 0–37)
Albumin: 4.5 g/dL (ref 3.5–5.2)
Alkaline Phosphatase: 55 U/L (ref 39–117)
BUN: 8 mg/dL (ref 6–23)
CO2: 29 mEq/L (ref 19–32)
Calcium: 9.9 mg/dL (ref 8.4–10.5)
Chloride: 103 mEq/L (ref 96–112)
Creatinine, Ser: 1 mg/dL (ref 0.40–1.50)
GFR: 88.87 mL/min (ref >60.00)
Glucose, Bld: 78 mg/dL (ref 70–99)
Potassium: 4.1 mEq/L (ref 3.5–5.1)
Sodium: 141 mEq/L (ref 135–145)
Total Bilirubin: 0.6 mg/dL (ref 0.2–1.2)
Total Protein: 7.2 g/dL (ref 6.0–8.3)

## 2021-11-27 LAB — URINALYSIS
Bilirubin Urine: NEGATIVE
Hgb urine dipstick: NEGATIVE
Ketones, ur: NEGATIVE
Leukocytes,Ua: NEGATIVE
Nitrite: NEGATIVE
Specific Gravity, Urine: <=1.005 — AB (ref 1.000–1.030)
Total Protein, Urine: NEGATIVE
Urine Glucose: NEGATIVE
Urobilinogen, UA: 0.2 (ref 0.0–1.0)
pH: 6.5 (ref 5.0–8.0)

## 2021-11-27 LAB — CBC WITH DIFFERENTIAL/PLATELET
Basophils Absolute: 0.1 10*3/uL (ref 0.0–0.1)
Basophils Relative: 1.3 % (ref 0.0–3.0)
Eosinophils Absolute: 0.4 10*3/uL (ref 0.0–0.7)
Eosinophils Relative: 5.2 % — ABNORMAL HIGH (ref 0.0–5.0)
HCT: 41.1 % (ref 39.0–52.0)
Hemoglobin: 13.3 g/dL (ref 13.0–17.0)
Lymphocytes Relative: 21.8 % (ref 12.0–46.0)
Lymphs Abs: 1.8 10*3/uL (ref 0.7–4.0)
MCHC: 32.2 g/dL (ref 30.0–36.0)
MCV: 88.8 fl (ref 78.0–100.0)
Monocytes Absolute: 0.9 10*3/uL (ref 0.1–1.0)
Monocytes Relative: 10.5 % (ref 3.0–12.0)
Neutro Abs: 5.1 10*3/uL (ref 1.4–7.7)
Neutrophils Relative %: 61.2 % (ref 43.0–77.0)
Platelets: 209 10*3/uL (ref 150.0–400.0)
RBC: 4.64 Mil/uL (ref 4.22–5.81)
RDW: 14.2 % (ref 11.5–15.5)
WBC: 8.4 10*3/uL (ref 4.0–10.5)

## 2021-11-27 LAB — LDL CHOLESTEROL, DIRECT: Direct LDL: 172 mg/dL

## 2021-11-27 LAB — LIPID PANEL
Cholesterol: 252 mg/dL — ABNORMAL HIGH (ref 0–200)
HDL: 36.4 mg/dL — ABNORMAL LOW (ref >39.00)
NonHDL: 215.98
Total CHOL/HDL Ratio: 7
Triglycerides: 230 mg/dL — ABNORMAL HIGH (ref 0.0–149.0)
VLDL: 46 mg/dL — ABNORMAL HIGH (ref 0.0–40.0)

## 2021-12-07 ENCOUNTER — Telehealth: Payer: Self-pay

## 2021-12-07 DIAGNOSIS — E7800 Pure hypercholesterolemia, unspecified: Secondary | ICD-10-CM

## 2021-12-07 MED ORDER — ATORVASTATIN CALCIUM 10 MG PO TABS: 10.0000 mg | ORAL_TABLET | Freq: Every day | ORAL | 1 refills | Status: DC

## 2021-12-07 NOTE — Telephone Encounter (Signed)
Patient aware of message above and will pick up Rx

## 2021-12-07 NOTE — Telephone Encounter (Signed)
Called [facility] went over labs and recommendations per patient he would like to start on something to help with cholesterol levels.

## 2021-12-10 ENCOUNTER — Encounter: Payer: Self-pay | Admitting: Podiatry

## 2021-12-10 ENCOUNTER — Ambulatory Visit (INDEPENDENT_AMBULATORY_CARE_PROVIDER_SITE_OTHER): Payer: Managed Care, Other (non HMO) | Admitting: Podiatry

## 2021-12-10 ENCOUNTER — Ambulatory Visit (INDEPENDENT_AMBULATORY_CARE_PROVIDER_SITE_OTHER): Payer: Managed Care, Other (non HMO)

## 2021-12-10 DIAGNOSIS — M21611 Bunion of right foot: Secondary | ICD-10-CM

## 2021-12-10 DIAGNOSIS — M21622 Bunionette of left foot: Secondary | ICD-10-CM | POA: Diagnosis not present

## 2021-12-10 DIAGNOSIS — M21612 Bunion of left foot: Secondary | ICD-10-CM

## 2021-12-10 DIAGNOSIS — M21619 Bunion of unspecified foot: Secondary | ICD-10-CM | POA: Diagnosis not present

## 2021-12-10 DIAGNOSIS — M21621 Bunionette of right foot: Secondary | ICD-10-CM

## 2021-12-10 NOTE — Progress Notes (Signed)
Subjective:   Patient ID: Edward Downs, male   DOB: 51 y.o.   MRN: [id number]   HPI Patient presents with bunion deformity bilateral and pain underneath the fifth metatarsal head that severe both feet.  States he is done a lot of trimming he is tried wider shoes has tried other modalities and does need to wear safety shoes which does cause him to have a lot of problems.  He is currently reducing his smoking and is taking Chantix for this and states that he tries to be active   Review of Systems  All other systems reviewed and are negative.       Objective:  Physical Exam Vitals and nursing note reviewed.  Constitutional:      Appearance: He is well-developed.  Pulmonary:     Effort: Pulmonary effort is normal.  Musculoskeletal:        General: Normal range of motion.  Skin:    General: Skin is warm.  Neurological:     Mental Status: He is alert.     Neurovascular status found to be intact muscle strength was found to be adequate range of motion within normal limits.  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  I do think that fifth metatarsal head resection along with distal osteotomy would be best for this patient long-term and I explained procedure risk and patient  would like to pursue a surgical intervention in this case.  I did discuss the recovery and doing 1 foot at a time we can do that approximately 3 to 4 weeks apart.  I did debride tissue today and we reviewed again the surgical correction he wants to get this done and would like to try to get it done the first week of August and he tentatively is scheduled for for surgery and will be seen back mid July  X-rays indicate the lesions directly underneath the fifth metatarsal head bilateral I did note structural bunion deformity with elevated metatarsal angles bilaterally of approximate 15 degrees

## 2021-12-31 ENCOUNTER — Other Ambulatory Visit: Payer: Self-pay | Admitting: Family Medicine

## 2021-12-31 DIAGNOSIS — Z720 Tobacco use: Secondary | ICD-10-CM

## 2022-01-14 ENCOUNTER — Ambulatory Visit (INDEPENDENT_AMBULATORY_CARE_PROVIDER_SITE_OTHER): Payer: Managed Care, Other (non HMO) | Admitting: Podiatry

## 2022-01-14 ENCOUNTER — Encounter: Payer: Self-pay | Admitting: Podiatry

## 2022-01-14 DIAGNOSIS — M21622 Bunionette of left foot: Secondary | ICD-10-CM | POA: Diagnosis not present

## 2022-01-14 DIAGNOSIS — M21621 Bunionette of right foot: Secondary | ICD-10-CM | POA: Diagnosis not present

## 2022-01-14 DIAGNOSIS — M21619 Bunion of unspecified foot: Secondary | ICD-10-CM

## 2022-01-15 NOTE — Progress Notes (Signed)
Subjective:   Patient ID: Edward Downs, male   DOB: 51 y.o.   MRN: [id number]   HPI Patient states he wants to discuss his surgery but he needs to hold off on it as he is gotten a new job and it can take a little time to be over the do it.  Patient states that what I did in the last visit really helped him and it started to come back and he is looking forward to getting this fixed   ROS      Objective:  Physical Exam  Neurovascular status intact with patient found to have prominent fifth metatarsal heads bilateral with keratotic tissue formation that is painful and painful bunion deformities bilateral with redness and irritation      Assessment:  Chronic structural deformity of both feet with keratotic lesion formation structural bunions     Plan:  H&P reviewed conditions discussed his options and what it would be best discussed shoe gear modifications temporarily did courtesy debridement and reviewed the surgical correction that will be necessary.  We went over what his postoperative period will look like and he will probably have to miss at least 12 weeks to work up to 83 with both feet.  Patient understands all this wants surgery and is going to call us and I would like to see him closer to the date to review what will be required postoperatively

## 2022-01-27 ENCOUNTER — Telehealth: Payer: Self-pay | Admitting: Urology

## 2022-01-27 NOTE — Telephone Encounter (Signed)
DOS - 02/16/22  [place] BUNIONECTOMY LEFT --- 55217 METATARSAL HEAD RESECTION 5TH LEFT --- 47159  [employer] EFFECTIVE DATE - 07/05/17  PLAN DEDUCTIBLE - $2,000.00 W/ $1,652.75 REMAINING OUT OF POCKET - $4,000.00 W/ $3,652.75 REMAINING COINSURANCE - 20% COPAY - $0.00   PER [employer]'S AUTO MATIVE SYSTEM FOR CPT CODES 53967 AND 28979 NO PRIOR AUTH IS REQUIRED.  REF # [id number]  REF # [phone]

## 2022-02-01 ENCOUNTER — Encounter: Payer: Self-pay | Admitting: Podiatry

## 2022-02-10 ENCOUNTER — Encounter: Payer: Self-pay | Admitting: Podiatry

## 2022-02-10 ENCOUNTER — Ambulatory Visit (INDEPENDENT_AMBULATORY_CARE_PROVIDER_SITE_OTHER): Payer: Managed Care, Other (non HMO) | Admitting: Podiatry

## 2022-02-10 DIAGNOSIS — M21621 Bunionette of right foot: Secondary | ICD-10-CM | POA: Diagnosis not present

## 2022-02-10 DIAGNOSIS — M21619 Bunion of unspecified foot: Secondary | ICD-10-CM

## 2022-02-10 DIAGNOSIS — M21622 Bunionette of left foot: Secondary | ICD-10-CM | POA: Diagnosis not present

## 2022-02-11 ENCOUNTER — Ambulatory Visit: Payer: Managed Care, Other (non HMO) | Admitting: Podiatry

## 2022-02-11 NOTE — Progress Notes (Signed)
Subjective:   Patient ID: Edward Downs, male   DOB: 51 y.o.   MRN: [id number]   HPI Patient presents with wife with severe pain on both fifth metatarsals and bunion pain bilateral states that he is ready to have surgery which is set for the next week.  Patient admits he smokes a pack of cigarettes a day and trying to reduce his overall health   ROS      Objective:  Physical Exam  Neurovascular status was found to be intact muscle strength adequate good digital perfusion noted with keratotic lesion very painful subfifth metatarsal head bilateral and prominent bunion formation left over right foot painful     Assessment:  Chronic lesion formation bilateral fifth metatarsal with skin structure that lends itself towards that with bunion deformity left over right      Plan:  H&P reviewed condition discussed and I have recommended structural correction of 1 foot at a time and patient wants left foot done first.  I reviewed the surgery explaining to him what would be done allowing him and wife to read alternative treatments complications and patient wants surgery and will have distal osteotomy left first metatarsal along with fifth metatarsal head resection.  I cannot give any guarantee skin will not recur due to his normal skin structure but I do think this will help with weight a bit and all questions were answered understanding total recovery can take upwards of 6 months.  Air fracture walker dispensed fitted to his leg and I want him to wear it prior to surgery to get used to it all questions answered surgery scheduled

## 2022-02-16 ENCOUNTER — Encounter: Payer: Self-pay | Admitting: Podiatry

## 2022-02-16 DIAGNOSIS — M21542 Acquired clubfoot, left foot: Secondary | ICD-10-CM | POA: Diagnosis not present

## 2022-02-16 DIAGNOSIS — M2012 Hallux valgus (acquired), left foot: Secondary | ICD-10-CM | POA: Diagnosis not present

## 2022-02-16 MED ORDER — OXYCODONE-ACETAMINOPHEN 10-325 MG PO TABS
1.0000 | ORAL_TABLET | Freq: Four times a day (QID) | ORAL | 0 refills | Status: DC | PRN
Start: 2022-02-16 — End: 2022-05-10

## 2022-02-16 MED ORDER — ONDANSETRON HCL 4 MG PO TABS: 4.0000 mg | ORAL_TABLET | Freq: Three times a day (TID) | ORAL | 0 refills | Status: DC | PRN

## 2022-02-16 NOTE — Addendum Note (Signed)
Addended by: Wallene Huh on: 02/16/2022 11:08 AM   Modules accepted: Orders

## 2022-02-19 ENCOUNTER — Telehealth: Payer: Self-pay | Admitting: Podiatry

## 2022-02-19 NOTE — Telephone Encounter (Signed)
Patient called about his short term disabilty paperwork , he needs it submitted by 02/25/22  , please fax to   [phone] [facility]  - he said he was not told anything about a processing fee  or needing more forms filled out.    Email:  [email or web address]  Phone [phone]

## 2022-02-22 ENCOUNTER — Ambulatory Visit (INDEPENDENT_AMBULATORY_CARE_PROVIDER_SITE_OTHER): Payer: Managed Care, Other (non HMO)

## 2022-02-22 ENCOUNTER — Encounter: Payer: Self-pay | Admitting: Podiatry

## 2022-02-22 ENCOUNTER — Ambulatory Visit (INDEPENDENT_AMBULATORY_CARE_PROVIDER_SITE_OTHER): Payer: Managed Care, Other (non HMO) | Admitting: Podiatry

## 2022-02-22 DIAGNOSIS — Z9889 Other specified postprocedural states: Secondary | ICD-10-CM | POA: Diagnosis not present

## 2022-02-22 NOTE — Progress Notes (Signed)
Subjective:   Patient ID: Edward Downs, male   DOB: 51 y.o.   MRN: [id number]   HPI Patient presents stating he is doing well overall but he is using crutches and he is just nervous about his foot and wants plans for doing the right foot also   ROS      Objective:  Physical Exam  Neuro vas ocular status intact negative Bevelyn Buckles' sign noted wound edges well coapted good alignment of the first metatarsal fifth metatarsal left good range of motion with significant structural deformity also of the right foot     Assessment:  Doing well post osteotomy left posterior fifth metatarsal left with structural deformity right     Plan:  H&P reviewed condition recommended correction of right foot in 3 weeks which I reviewed and for the left 1 continue elevation compression immobilization with sterile dressing reapplied and begin range of motion exercises.  Reappoint to recheck 2 weeks  X-rays indicate osteotomies healing well good alignment noted joint congruence

## 2022-03-11 ENCOUNTER — Ambulatory Visit (INDEPENDENT_AMBULATORY_CARE_PROVIDER_SITE_OTHER): Payer: Managed Care, Other (non HMO) | Admitting: Podiatry

## 2022-03-11 ENCOUNTER — Ambulatory Visit (INDEPENDENT_AMBULATORY_CARE_PROVIDER_SITE_OTHER): Payer: Managed Care, Other (non HMO)

## 2022-03-11 ENCOUNTER — Telehealth: Payer: Self-pay | Admitting: Urology

## 2022-03-11 DIAGNOSIS — Z9889 Other specified postprocedural states: Secondary | ICD-10-CM

## 2022-03-11 DIAGNOSIS — M21621 Bunionette of right foot: Secondary | ICD-10-CM | POA: Diagnosis not present

## 2022-03-11 DIAGNOSIS — M21611 Bunion of right foot: Secondary | ICD-10-CM | POA: Diagnosis not present

## 2022-03-11 NOTE — Telephone Encounter (Signed)
DOS - 03/23/22    MCBRIDE BUNIONECTOMY RIGHT --- 05697 METATARSAL HEAD RESECTION 5TH RIGHT --- 94801   [employer] EFFECTIVE DATE - 07/05/17   PLAN DEDUCTIBLE - $2,000.00 W/ $0.00 REMAINING OUT OF POCKET - $4,000.00 W/ $1,747.48 REMAINING COINSURANCE - 20% COPAY - $0.00     PER [employer]'S AUTO MATIVE SYSTEM FOR CPT CODES 65537 AND 48270 NO PRIOR AUTH IS REQUIRED.   REF # [id number]  REF # [id number]

## 2022-03-12 NOTE — Progress Notes (Signed)
Subjective:   Patient ID: Edward Downs, male   DOB: 51 y.o.   MRN: [id number]   HPI Patient presents stating he is doing well with his left foot and is here to schedule his right foot.  Still wearing surgical shoe left   ROS      Objective:  Physical Exam  Neuro vascular status intact negative Bevelyn Buckles' sign was noted wound edges coapted well first MPJ good alignment and fifth MPJ healing well with patient having minimal pathology.  Right foot shows structural bunion not to the same degree as the left one and also severe callus with enlargement of the head of the fifth metatarsal right     Assessment:  Doing well postop left foot from surgical correction and structural bunion deformity right with pain on the first metatarsal head and also severe tailor's bunion deformity right      Plan:  H&P reviewed conditions and discussed.  At this point I have recommended correction of the right foot and I reviewed consent form with him for a more modified McBride bunionectomy as there is no IM angle increase just a big bump with release of the adductor tendon and fifth metatarsal head resection.  I allowed him to read consent form going over the procedure and risk and he wants surgery and after extensive review signed consent form for surgical correction.  Left foot x-ray reviewed begin soft shoe usage on the left foot  X-rays indicate that the osteotomy is healing well fixation in place fifth metatarsal head resection doing well left

## 2022-03-16 ENCOUNTER — Ambulatory Visit (INDEPENDENT_AMBULATORY_CARE_PROVIDER_SITE_OTHER): Payer: Managed Care, Other (non HMO) | Admitting: Family Medicine

## 2022-03-16 ENCOUNTER — Encounter: Payer: Self-pay | Admitting: Family Medicine

## 2022-03-16 ENCOUNTER — Other Ambulatory Visit (HOSPITAL_COMMUNITY)
Admission: RE | Admit: 2022-03-16 | Discharge: 2022-03-16 | Disposition: A | Discharge status: Home / Self Care | Payer: Managed Care, Other (non HMO) | Source: Ambulatory Visit | Attending: Family Medicine | Admitting: Family Medicine

## 2022-03-16 VITALS — BP 122/80 | HR 106 | Temp 97.9°F | Ht 74.0 in | Wt 195.8 lb

## 2022-03-16 DIAGNOSIS — Z113 Encounter for screening for infections with a predominantly sexual mode of transmission: Secondary | ICD-10-CM | POA: Insufficient documentation

## 2022-03-16 DIAGNOSIS — Z720 Tobacco use: Secondary | ICD-10-CM

## 2022-03-16 DIAGNOSIS — E782 Mixed hyperlipidemia: Secondary | ICD-10-CM | POA: Diagnosis not present

## 2022-03-16 MED ORDER — VARENICLINE TARTRATE 1 MG PO TABS: 1.0000 mg | ORAL_TABLET | Freq: Two times a day (BID) | ORAL | 1 refills | Status: DC

## 2022-03-16 NOTE — Progress Notes (Signed)
Established Patient Office Visit  Subjective   Patient ID: Edward Downs, male    DOB: 12-08-1970  Age: 51 y.o. MRN: [id number]  Chief Complaint  Patient presents with   Exposure to STD    STD testing, no concerns.     Exposure to STD Pertinent negatives include no abdominal pain, dysuria, frequency, rash or urgency.   for follow-up of smoking cessation treated with Chantix and STD check.  Had done well with the Chantix starter pack.  Had quit smoking.  Increased stress with marital discord and need for bilateral foot surgeries led to tobacco use again.  He did well with the Chantix.  He denied depression and anger or bad dreams.  He would like to restart it.  He left his wife for a month for time away.  She is suspicious that he had slept with somebody else.  Where he was living was in [place] of their house.  He has had no other sexual partners other than her for 3 years before they were married.  They have been married 1 year.  He has no symptoms.  He has been taking his atorvastatin.  He took a break during his surgeries.    Review of Systems  Constitutional: Negative.   HENT: Negative.    Eyes:  Negative for blurred vision, discharge and redness.  Respiratory: Negative.    Cardiovascular: Negative.   Gastrointestinal:  Negative for abdominal pain.  Genitourinary: Negative.  Negative for dysuria, frequency, hematuria and urgency.  Musculoskeletal: Negative.  Negative for myalgias.  Skin:  Negative for itching and rash.  Neurological:  Negative for tingling, loss of consciousness and weakness.  Endo/Heme/Allergies:  Negative for polydipsia.      Objective:     BP 122/80 (BP Location: Right Arm, Patient Position: Sitting, Cuff Size: Normal)   Pulse (!) 106   Temp 97.9 F (36.6 C) (Temporal)   Ht '6\' 2"'$  (1.88 m)   Wt 195 lb 12.8 oz (88.8 kg)   SpO2 95%   BMI 25.14 kg/m    Physical Exam Constitutional:      General: He is not in acute distress.    Appearance:  Normal appearance. He is not ill-appearing, toxic-appearing or diaphoretic.  HENT:     Head: Normocephalic and atraumatic.     Right Ear: External ear normal.     Left Ear: External ear normal.  Eyes:     General: No scleral icterus.       Right eye: No discharge.        Left eye: No discharge.     Extraocular Movements: Extraocular movements intact.     Conjunctiva/sclera: Conjunctivae normal.  Cardiovascular:     Rate and Rhythm: Normal rate.  Pulmonary:     Effort: Pulmonary effort is normal. No respiratory distress.  Abdominal:     Hernia: There is no hernia in the left inguinal area or right inguinal area.  Genitourinary:    Penis: Uncircumcised. No phimosis, paraphimosis, hypospadias, erythema, tenderness, discharge, swelling or lesions.      Testes:        Right: Mass, tenderness or swelling not present. Right testis is descended.        Left: Mass, tenderness or swelling not present. Left testis is descended.     Epididymis:     Right: Not inflamed or enlarged.     Left: Not inflamed or enlarged.  Lymphadenopathy:     Lower Body: No right inguinal adenopathy. No left inguinal  adenopathy.  Skin:    General: Skin is warm and dry.  Neurological:     Mental Status: He is alert and oriented to person, place, and time.  Psychiatric:        Mood and Affect: Mood normal.        Behavior: Behavior normal.      No results found for any visits on 03/16/22.    The 10-year ASCVD risk score (Arnett DK, et al., 2019) is: 9.3%    Assessment & Plan:   Problem List Items Addressed This Visit       Other   Tobacco abuse disorder - Primary   Relevant Medications   varenicline (CHANTIX CONTINUING MONTH PAK) 1 MG tablet   Screen for STD (sexually transmitted disease)   Relevant Orders   HIV Antibody (routine testing w rflx)   Urine cytology ancillary only   RPR   Elevated triglycerides with high cholesterol    Return in about 6 weeks (around 04/27/2022).    Libby Maw, MD

## 2022-03-17 LAB — URINE CYTOLOGY ANCILLARY ONLY
Chlamydia: NEGATIVE
Comment: NEGATIVE
Comment: NORMAL
Neisseria Gonorrhea: NEGATIVE

## 2022-03-17 LAB — HIV ANTIBODY (ROUTINE TESTING W REFLEX): HIV 1&2 Ab, 4th Generation: NONREACTIVE

## 2022-03-17 LAB — RPR: RPR Ser Ql: NONREACTIVE

## 2022-03-22 ENCOUNTER — Encounter: Payer: Managed Care, Other (non HMO) | Admitting: Podiatry

## 2022-03-22 MED ORDER — HYDROCODONE-ACETAMINOPHEN 10-325 MG PO TABS: 1.0000 | ORAL_TABLET | Freq: Three times a day (TID) | ORAL | 0 refills | Status: AC | PRN

## 2022-03-22 NOTE — Addendum Note (Signed)
Addended by: Wallene Huh on: 03/22/2022 02:26 PM   Modules accepted: Orders

## 2022-03-23 ENCOUNTER — Encounter: Payer: Self-pay | Admitting: Podiatry

## 2022-03-23 DIAGNOSIS — M2011 Hallux valgus (acquired), right foot: Secondary | ICD-10-CM | POA: Diagnosis not present

## 2022-03-29 ENCOUNTER — Ambulatory Visit (INDEPENDENT_AMBULATORY_CARE_PROVIDER_SITE_OTHER): Payer: Managed Care, Other (non HMO) | Admitting: Podiatry

## 2022-03-29 ENCOUNTER — Ambulatory Visit (INDEPENDENT_AMBULATORY_CARE_PROVIDER_SITE_OTHER): Payer: Managed Care, Other (non HMO)

## 2022-03-29 ENCOUNTER — Encounter: Payer: Self-pay | Admitting: Podiatry

## 2022-03-29 DIAGNOSIS — Z9889 Other specified postprocedural states: Secondary | ICD-10-CM

## 2022-03-29 DIAGNOSIS — M2011 Hallux valgus (acquired), right foot: Secondary | ICD-10-CM

## 2022-03-29 NOTE — Progress Notes (Signed)
Subjective:   Patient ID: Edward Downs, male   DOB: 51 y.o.   MRN: [id number]   HPI Patient states he is doing very well with surgery very pleased so far with how things are going and states the right foot continues to improve but does have swelling   ROS      Objective:  Physical Exam  Neurovascular status intact with well-healing surgical sites right 1 week after having foot surgery and left doing well with alignment good and good range of motion no crepitus      Assessment:  Doing well post foot surgery right and for the left doing well approximate 5 weeks after surgery     Plan:  H&P x-rays reviewed of both feet and for the right foot reapplied sterile dressing continue immobilization and range of motion exercises and reappoint to recheck.  For the left can resume normal shoe gear  X-rays indicate osteotomies are healing well good alignment noted with fixation in place left in good structural correction

## 2022-04-07 ENCOUNTER — Other Ambulatory Visit: Payer: Self-pay | Admitting: Family Medicine

## 2022-04-07 DIAGNOSIS — Z720 Tobacco use: Secondary | ICD-10-CM

## 2022-04-09 ENCOUNTER — Encounter: Payer: Self-pay | Admitting: Podiatry

## 2022-04-16 ENCOUNTER — Telehealth: Payer: Self-pay | Admitting: *Deleted

## 2022-04-16 NOTE — Telephone Encounter (Signed)
Patient is calling to request the date that he will be able to return back to work, had surgery  on 03/23/22.

## 2022-04-16 NOTE — Telephone Encounter (Signed)
Whenever he feels comfortable going

## 2022-04-19 ENCOUNTER — Encounter: Payer: Self-pay | Admitting: Podiatry

## 2022-04-19 ENCOUNTER — Ambulatory Visit (INDEPENDENT_AMBULATORY_CARE_PROVIDER_SITE_OTHER): Payer: Managed Care, Other (non HMO) | Admitting: Podiatry

## 2022-04-19 ENCOUNTER — Ambulatory Visit (INDEPENDENT_AMBULATORY_CARE_PROVIDER_SITE_OTHER): Payer: Managed Care, Other (non HMO)

## 2022-04-19 DIAGNOSIS — Z9889 Other specified postprocedural states: Secondary | ICD-10-CM

## 2022-04-19 DIAGNOSIS — M216X1 Other acquired deformities of right foot: Secondary | ICD-10-CM

## 2022-04-19 DIAGNOSIS — M216X2 Other acquired deformities of left foot: Secondary | ICD-10-CM | POA: Diagnosis not present

## 2022-04-19 DIAGNOSIS — M779 Enthesopathy, unspecified: Secondary | ICD-10-CM

## 2022-04-21 NOTE — Progress Notes (Signed)
Subjective:   Patient ID: Edward Downs, male   DOB: 51 y.o.   MRN: [id number]   HPI Patient states doing quite a bit better wearing soft shoes wondering if I can go back to work in November   ROS      Objective:  Physical Exam  Neuro vascular status intact negative Bevelyn Buckles' sign noted patient is healing well post osteotomy surgery bilateral along with metatarsal head resection fifth bilateral with mild to moderate swelling still noted consistent with healing     Assessment:  Doing well post surgical correction of left and right foot over the last several months still healing with swelling condition still noted consistent for this.  Postop     Plan:  H&P reviewed condition and x-ray and at this point he will gradually increasing his activity and slowly start to wear other shoes.  I do think that steel toe shoes are can to be difficult for a period of time and I would prefer he wears tennis shoes to work and I made him aware that condition  X-rays indicate that the bone is healing well good alignment noted no indications of other pathology currently

## 2022-04-22 ENCOUNTER — Telehealth: Payer: Self-pay | Admitting: Podiatry

## 2022-04-22 NOTE — Telephone Encounter (Signed)
I called Edward Downs to speak with him about his disability. We finished our conversation and he wanted to know about his future appt. Before returning to work 05/12/2022. [name] will need to wear tennis shoes to work for a while before going back into steel toe shoes, per Dr. Paulla Dolly. [name] works for [employer] and hes not sure if he can wear tennis shoes to work. He is waiting on a response from his job as to wearing the tennis shoes.   He,s. also waiting for a appt. With Dr. Paulla Dolly before returning to work and to pick up his orthotics when ready.

## 2022-04-27 ENCOUNTER — Ambulatory Visit (INDEPENDENT_AMBULATORY_CARE_PROVIDER_SITE_OTHER): Payer: Managed Care, Other (non HMO) | Admitting: Family Medicine

## 2022-04-27 ENCOUNTER — Encounter: Payer: Self-pay | Admitting: Family Medicine

## 2022-04-27 VITALS — BP 136/78 | HR 108 | Temp 97.7°F | Ht 74.0 in | Wt 190.4 lb

## 2022-04-27 DIAGNOSIS — E782 Mixed hyperlipidemia: Secondary | ICD-10-CM | POA: Diagnosis not present

## 2022-04-27 DIAGNOSIS — E7800 Pure hypercholesterolemia, unspecified: Secondary | ICD-10-CM

## 2022-04-27 DIAGNOSIS — K5901 Slow transit constipation: Secondary | ICD-10-CM | POA: Insufficient documentation

## 2022-04-27 DIAGNOSIS — I10 Essential (primary) hypertension: Secondary | ICD-10-CM

## 2022-04-27 DIAGNOSIS — Z720 Tobacco use: Secondary | ICD-10-CM

## 2022-04-27 DIAGNOSIS — R009 Unspecified abnormalities of heart beat: Secondary | ICD-10-CM

## 2022-04-27 DIAGNOSIS — K921 Melena: Secondary | ICD-10-CM | POA: Diagnosis not present

## 2022-04-27 MED ORDER — ATORVASTATIN CALCIUM 10 MG PO TABS: 10.0000 mg | ORAL_TABLET | Freq: Every day | ORAL | 1 refills | Status: DC

## 2022-04-27 MED ORDER — VARENICLINE TARTRATE 1 MG PO TABS: 1.0000 mg | ORAL_TABLET | Freq: Two times a day (BID) | ORAL | 1 refills | Status: DC

## 2022-04-27 MED ORDER — DOCUSATE SODIUM 100 MG PO CAPS: 100.0000 mg | ORAL_CAPSULE | Freq: Two times a day (BID) | ORAL | 0 refills | Status: DC

## 2022-04-27 NOTE — Progress Notes (Signed)
Established Patient Office Visit  Subjective   Patient ID: Edward Downs, male    DOB: 12/19/1970  Age: 51 y.o. MRN: [id number]  Chief Complaint  Patient presents with   Follow-up    6 week follow up, concerns about irregular bowl movement muscles tightening up.     HPI for follow-up of smoking cessation with Chantix.  Continue Chantix without issue but also continues to smoke some.  A pack is lasting him a week at this point.  Currently little stressed.  Closing on his new house soon.  Is experienced some tightness in his upper shoulder area.  He has been stretching it out.  He has had a rash that comes and goes about his body after changing soaps.  Continues with atorvastatin.  He has noted some blood when wiping and on his stool.  He is also experiencing constipation.  He does enjoy sitting on the toilet for prolonged periods of time playing games on his computer.  He has never had a colonoscopy.    Review of Systems  Constitutional: Negative.   HENT: Negative.    Eyes:  Negative for blurred vision, discharge and redness.  Respiratory: Negative.  Negative for shortness of breath and wheezing.   Cardiovascular: Negative.  Negative for chest pain.  Gastrointestinal:  Negative for abdominal pain.  Genitourinary: Negative.   Musculoskeletal: Negative.  Negative for myalgias.  Skin:  Positive for rash.  Neurological:  Negative for tingling, loss of consciousness and weakness.  Endo/Heme/Allergies:  Negative for polydipsia.  Psychiatric/Behavioral:  The patient is nervous/anxious.       Objective:     BP 136/78 (BP Location: Right Arm, Patient Position: Sitting, Cuff Size: Normal)   Pulse (!) 108   Temp 97.7 F (36.5 C) (Temporal)   Ht '6\' 2"'$  (1.88 m)   Wt 190 lb 6.4 oz (86.4 kg)   SpO2 97%   BMI 24.45 kg/m    Physical Exam Constitutional:      General: He is not in acute distress.    Appearance: Normal appearance. He is not ill-appearing, toxic-appearing or  diaphoretic.  HENT:     Head: Normocephalic and atraumatic.     Right Ear: External ear normal.     Left Ear: External ear normal.     Mouth/Throat:     Mouth: Mucous membranes are moist.     Pharynx: Oropharynx is clear. No oropharyngeal exudate or posterior oropharyngeal erythema.  Eyes:     General: No scleral icterus.       Right eye: No discharge.        Left eye: No discharge.     Extraocular Movements: Extraocular movements intact.     Conjunctiva/sclera: Conjunctivae normal.     Pupils: Pupils are equal, round, and reactive to light.  Cardiovascular:     Rate and Rhythm: Normal rate and regular rhythm.  Pulmonary:     Effort: Pulmonary effort is normal. No respiratory distress.     Breath sounds: Normal breath sounds.  Abdominal:     General: Bowel sounds are normal. There is no distension.     Tenderness: There is no abdominal tenderness. There is no guarding.  Musculoskeletal:     Cervical back: Normal range of motion. No rigidity or tenderness.  Skin:    General: Skin is warm and dry.       Neurological:     Mental Status: He is alert and oriented to person, place, and time.  Psychiatric:  Mood and Affect: Mood normal.        Behavior: Behavior normal.      No results found for any visits on 04/27/22.    The 10-year ASCVD risk score (Arnett DK, et al., 2019) is: 11.2%    Assessment & Plan:   Problem List Items Addressed This Visit       Digestive   Slow transit constipation   Relevant Medications   docusate sodium (COLACE) 100 MG capsule     Other   Tobacco abuse disorder - Primary   Relevant Medications   varenicline (CHANTIX CONTINUING MONTH PAK) 1 MG tablet   Mixed hyperlipidemia   Relevant Medications   atorvastatin (LIPITOR) 10 MG tablet   Hematochezia   Relevant Orders   Ambulatory referral to Gastroenterology   Other Visit Diagnoses     Elevated cholesterol       Relevant Medications   atorvastatin (LIPITOR) 10 MG tablet    Other Relevant Orders   Lipid panel   Elevated heart rate with elevated blood pressure and diagnosis of hypertension       Relevant Medications   atorvastatin (LIPITOR) 10 MG tablet   Other Relevant Orders   Urinalysis, Routine w reflex microscopic   TSH       Return in about 3 months (around 07/28/2022), or Return fasting for labs only in a few weeks..  Continue Chantix and atorvastatin.  Try to stop smoking.  Return in a few weeks for repeat lipid profile TSH and urinalysis.  Patient is stressed.  Avoid prolonged toilet bowl sitting.  We will referral for GI consultation and probable first colonoscopy.  We will change soaps to back to for men.  Libby Maw, MD

## 2022-04-28 ENCOUNTER — Other Ambulatory Visit (INDEPENDENT_AMBULATORY_CARE_PROVIDER_SITE_OTHER): Payer: Managed Care, Other (non HMO)

## 2022-04-28 ENCOUNTER — Encounter: Payer: Self-pay | Admitting: Nurse Practitioner

## 2022-04-28 DIAGNOSIS — R009 Unspecified abnormalities of heart beat: Secondary | ICD-10-CM | POA: Diagnosis not present

## 2022-04-28 DIAGNOSIS — R81 Glycosuria: Secondary | ICD-10-CM

## 2022-04-28 DIAGNOSIS — E7800 Pure hypercholesterolemia, unspecified: Secondary | ICD-10-CM

## 2022-04-28 DIAGNOSIS — I10 Essential (primary) hypertension: Secondary | ICD-10-CM | POA: Diagnosis not present

## 2022-04-28 LAB — LIPID PANEL
Cholesterol: 184 mg/dL (ref 0–200)
HDL: 34.4 mg/dL — ABNORMAL LOW (ref >39.00)
NonHDL: 149.49
Total CHOL/HDL Ratio: 5
Triglycerides: 247 mg/dL — ABNORMAL HIGH (ref 0.0–149.0)
VLDL: 49.4 mg/dL — ABNORMAL HIGH (ref 0.0–40.0)

## 2022-04-28 LAB — TSH: TSH: 0.46 u[IU]/mL (ref 0.35–5.50)

## 2022-04-28 LAB — LDL CHOLESTEROL, DIRECT: Direct LDL: 108 mg/dL

## 2022-04-29 ENCOUNTER — Ambulatory Visit (INDEPENDENT_AMBULATORY_CARE_PROVIDER_SITE_OTHER): Payer: Managed Care, Other (non HMO)

## 2022-04-29 ENCOUNTER — Encounter: Payer: Self-pay | Admitting: Podiatry

## 2022-04-29 ENCOUNTER — Ambulatory Visit (INDEPENDENT_AMBULATORY_CARE_PROVIDER_SITE_OTHER): Payer: Managed Care, Other (non HMO) | Admitting: Podiatry

## 2022-04-29 DIAGNOSIS — Z9889 Other specified postprocedural states: Secondary | ICD-10-CM

## 2022-04-29 LAB — URINALYSIS, ROUTINE W REFLEX MICROSCOPIC
Bilirubin Urine: NEGATIVE
Hgb urine dipstick: NEGATIVE
Ketones, ur: 15 — AB
Leukocytes,Ua: NEGATIVE
Nitrite: NEGATIVE
RBC / HPF: NONE SEEN (ref 0–?)
Specific Gravity, Urine: 1.02 (ref 1.000–1.030)
Total Protein, Urine: NEGATIVE
Urine Glucose: >=1000 — AB
Urobilinogen, UA: 1 (ref 0.0–1.0)
pH: 6 (ref 5.0–8.0)

## 2022-04-29 NOTE — Telephone Encounter (Signed)
Patient was seen today and said that it may be a couple of weeks before ready to return back to work.

## 2022-04-30 NOTE — Addendum Note (Signed)
Addended by: Jon Billings on: 04/30/2022 01:22 PM   Modules accepted: Orders

## 2022-05-02 NOTE — Progress Notes (Signed)
Subjective:   Patient ID: Edward Downs, male   DOB: 51 y.o.   MRN: [id number]   HPI Patient states he was concerned because he had some bleeding on his right foot and he may have overdone it.  Overall doing very well    ROS      Objective:  Physical Exam  Neurovascular status intact negative Bevelyn Buckles' sign noted slight bleeding on the proximal portion of the fifth metatarsal incision but very localized no erythema edema or drainage associated with     Assessment:  Probability of some irritation of the incision site right     Plan:  Watch condition recommended that just be careful with it applied a small amount of Neosporin Band-Aid and patient can do this as needed  X-rays are negative for signs of pathology appears to be soft tissue

## 2022-05-03 ENCOUNTER — Other Ambulatory Visit (INDEPENDENT_AMBULATORY_CARE_PROVIDER_SITE_OTHER): Payer: Managed Care, Other (non HMO)

## 2022-05-03 ENCOUNTER — Encounter: Payer: Self-pay | Admitting: Podiatry

## 2022-05-03 ENCOUNTER — Ambulatory Visit (INDEPENDENT_AMBULATORY_CARE_PROVIDER_SITE_OTHER): Payer: Managed Care, Other (non HMO) | Admitting: Podiatry

## 2022-05-03 DIAGNOSIS — R81 Glycosuria: Secondary | ICD-10-CM

## 2022-05-03 DIAGNOSIS — L02611 Cutaneous abscess of right foot: Secondary | ICD-10-CM

## 2022-05-03 DIAGNOSIS — L03119 Cellulitis of unspecified part of limb: Secondary | ICD-10-CM

## 2022-05-03 LAB — HEMOGLOBIN A1C: Hgb A1c MFr Bld: 11.8 % — ABNORMAL HIGH (ref 4.6–6.5)

## 2022-05-03 MED ORDER — DOXYCYCLINE HYCLATE 100 MG PO TABS: 100.0000 mg | ORAL_TABLET | Freq: Two times a day (BID) | ORAL | 1 refills | Status: DC

## 2022-05-03 NOTE — Progress Notes (Signed)
Patient has diabetes.  Please schedule appointment for him to see me.

## 2022-05-03 NOTE — Progress Notes (Unsigned)
wound

## 2022-05-04 NOTE — Progress Notes (Signed)
Subjective:   Patient ID: Edward Downs, male   DOB: 51 y.o.   MRN: [id number]   HPI Patient presents stating the spot on the outside of my foot has become a little bit more irritated and I wanted to get it checked.  States that he was a little bit active with his foot and may have caused it to breakdown and that it is mildly tender with patient having no systemic signs of infection no chills no fever   ROS      Objective:  Physical Exam  Neurovascular status intact with a small area of dehiscence on the right fifth metatarsal with a thick purulent drainage localized.  There was no proximal edema erythema or drainage noted or distal     Assessment:  Low level cellulitic event in the right fifth metatarsal incision site with the first metatarsal healing fine.  All indications are that it is local     Plan:  Reviewed the importance of taking his temperature daily which was normal at this time and advised him on wet-to-dry soaks.  At this time I did a proximal nerve block of the area I then went ahead I opened the area up with a sterile 15 blade I was able to get out a small amount of purulent material and I first went ahead and cultured this and send it off for culture sensitivity.  I flushed the area and applied sterile dressing with Neosporin and patient will begin wet-to-dry dressings of this area and I also placed on doxycycline.  It should be uneventful and healing strict instructions if any redness swelling any systemic signs of infection that this is considered an emergency and he needs to let us know and may need to go to emergency room if he is having problems.  Reappoint to recheck encouraged to call questions concerns

## 2022-05-06 LAB — WOUND CULTURE
MICRO NUMBER:: 14118266
SPECIMEN QUALITY:: ADEQUATE

## 2022-05-07 ENCOUNTER — Telehealth: Payer: Self-pay | Admitting: *Deleted

## 2022-05-07 NOTE — Telephone Encounter (Signed)
Patient is calling for wound culture from 05/03/22, please advise.

## 2022-05-10 ENCOUNTER — Ambulatory Visit (INDEPENDENT_AMBULATORY_CARE_PROVIDER_SITE_OTHER): Payer: Managed Care, Other (non HMO) | Admitting: Family Medicine

## 2022-05-10 ENCOUNTER — Encounter: Payer: Self-pay | Admitting: Family Medicine

## 2022-05-10 VITALS — BP 116/83 | HR 102 | Temp 98.2°F | Ht 74.0 in | Wt 190.2 lb

## 2022-05-10 DIAGNOSIS — E782 Mixed hyperlipidemia: Secondary | ICD-10-CM | POA: Diagnosis not present

## 2022-05-10 DIAGNOSIS — E7800 Pure hypercholesterolemia, unspecified: Secondary | ICD-10-CM | POA: Diagnosis not present

## 2022-05-10 DIAGNOSIS — Z720 Tobacco use: Secondary | ICD-10-CM | POA: Diagnosis not present

## 2022-05-10 DIAGNOSIS — E1165 Type 2 diabetes mellitus with hyperglycemia: Secondary | ICD-10-CM | POA: Diagnosis not present

## 2022-05-10 MED ORDER — METFORMIN HCL ER 500 MG PO TB24: 500.0000 mg | ORAL_TABLET | Freq: Two times a day (BID) | ORAL | 2 refills | Status: DC

## 2022-05-10 NOTE — Progress Notes (Signed)
Established Patient Office Visit  Subjective   Patient ID: Edward Downs, male    DOB: 19-Jun-1971  Age: 51 y.o. MRN: [id number]  Chief Complaint  Patient presents with   Follow-up    Follow up for recent labs. Would like to discuss antibiotic given by podiatrist     HPI follow-up of type 2 diabetes onset and treatment plan.  Positive family history of diabetes with his mother.  Admits that he has been less active with recent foot surgery.  Doing well with Chantix.  Denies anger depression or bad dreams.  Continues with atorvastatin.  Continues follow-up with podiatry for recent surgery.  Recent culture grew out staph sensitive to the doxycycline he was given.    Review of Systems  Constitutional: Negative.   HENT: Negative.    Eyes:  Negative for blurred vision, discharge and redness.  Respiratory: Negative.    Cardiovascular: Negative.   Gastrointestinal:  Negative for abdominal pain.  Genitourinary: Negative.   Musculoskeletal: Negative.  Negative for myalgias.  Skin:  Negative for rash.  Neurological:  Negative for tingling, loss of consciousness and weakness.  Endo/Heme/Allergies:  Negative for polydipsia.      Objective:     BP 116/83 (BP Location: Right Arm, Patient Position: Sitting, Cuff Size: Normal)   Pulse (!) 102   Temp 98.2 F (36.8 C)   Ht '6\' 2"'$  (1.88 m)   Wt 190 lb 3.2 oz (86.3 kg)   SpO2 95%   BMI 24.42 kg/m  BP Readings from Last 3 Encounters:  05/10/22 116/83  04/27/22 136/78  03/16/22 122/80   Wt Readings from Last 3 Encounters:  05/10/22 190 lb 3.2 oz (86.3 kg)  04/27/22 190 lb 6.4 oz (86.4 kg)  03/16/22 195 lb 12.8 oz (88.8 kg)      Physical Exam Constitutional:      General: He is not in acute distress.    Appearance: Normal appearance. He is not ill-appearing, toxic-appearing or diaphoretic.  HENT:     Head: Normocephalic and atraumatic.     Right Ear: External ear normal.     Left Ear: External ear normal.  Eyes:      General: No scleral icterus.       Right eye: No discharge.        Left eye: No discharge.     Extraocular Movements: Extraocular movements intact.     Conjunctiva/sclera: Conjunctivae normal.  Pulmonary:     Effort: Pulmonary effort is normal. No respiratory distress.  Skin:    General: Skin is warm and dry.  Neurological:     Mental Status: He is alert and oriented to person, place, and time.  Psychiatric:        Mood and Affect: Mood normal.        Behavior: Behavior normal.      No results found for any visits on 05/10/22.    The 10-year ASCVD risk score (Arnett DK, et al., 2019) is: 14.5%    Assessment & Plan:   Problem List Items Addressed This Visit       Other   Tobacco abuse disorder - Primary   Other Visit Diagnoses     Type 2 diabetes mellitus with hyperglycemia, without long-term current use of insulin (HCC)       Relevant Medications   metFORMIN (GLUCOPHAGE-XR) 500 MG 24 hr tablet   Other Relevant Orders   Ambulatory referral to diabetic education   Elevated cholesterol       Elevated  triglycerides with high cholesterol           Return in about 3 months (around 08/10/2022).  We will start metformin XL 500 mg twice daily with meals.  Warned about cramping and loose stooling.  He will let me know.  He will avoid sweets and sweetened drinks in particular.  He will limit carbohydrate consumption to no more than 60 g per meal.  He will go for diabetic teaching and his wife will accompany him..  Continue smoking cessation efforts Chantix for smoking cessation.  Fortunately he is having no side effects.  Continue atorvastatin 10 mg daily for LDL cholesterol.  He has decreased the fat and cholesterol in his diet.  Fortunately triglycerides are lower.  Mention metabolic syndrome.  His mom had diabetes.  Continue follow-up with podiatry.  Libby Maw, MD

## 2022-05-17 ENCOUNTER — Telehealth: Payer: Self-pay | Admitting: Podiatry

## 2022-05-17 NOTE — Telephone Encounter (Signed)
Let him know common bacteria. If he is having any issue please make appointment for him

## 2022-05-17 NOTE — Telephone Encounter (Signed)
Left message on vm for patient to call back to schedule picking up orthotic.    Balance $490 - included in his repayment plan.

## 2022-05-17 NOTE — Telephone Encounter (Signed)
Antibiotic has been working pretty good, will call back if it worsen or fail to improve.

## 2022-05-19 ENCOUNTER — Ambulatory Visit (INDEPENDENT_AMBULATORY_CARE_PROVIDER_SITE_OTHER): Payer: Self-pay

## 2022-05-19 DIAGNOSIS — M779 Enthesopathy, unspecified: Secondary | ICD-10-CM

## 2022-05-19 NOTE — Progress Notes (Signed)
Patient presents today to pick up custom molded foot orthotics, diagnosed with Capsulitis by Dr. Paulla Dolly.   Orthotics were dispensed and fit was satisfactory. Reviewed instructions for break-in and wear. Written instructions given to patient.  Patient will follow up as needed.   [facility] - order # [id number]

## 2022-05-20 ENCOUNTER — Other Ambulatory Visit: Payer: Self-pay | Admitting: Family Medicine

## 2022-05-20 DIAGNOSIS — Z720 Tobacco use: Secondary | ICD-10-CM

## 2022-06-07 ENCOUNTER — Encounter: Payer: Self-pay | Admitting: Nurse Practitioner

## 2022-06-07 ENCOUNTER — Ambulatory Visit (INDEPENDENT_AMBULATORY_CARE_PROVIDER_SITE_OTHER): Payer: Managed Care, Other (non HMO) | Admitting: Nurse Practitioner

## 2022-06-07 VITALS — BP 130/80 | HR 117 | Ht 75.0 in | Wt 194.2 lb

## 2022-06-07 DIAGNOSIS — K625 Hemorrhage of anus and rectum: Secondary | ICD-10-CM | POA: Diagnosis not present

## 2022-06-07 DIAGNOSIS — K644 Residual hemorrhoidal skin tags: Secondary | ICD-10-CM

## 2022-06-07 DIAGNOSIS — K648 Other hemorrhoids: Secondary | ICD-10-CM

## 2022-06-07 MED ORDER — NA SULFATE-K SULFATE-MG SULF 17.5-3.13-1.6 GM/177ML PO SOLN: 1.0000 | Freq: Once | ORAL | 0 refills | Status: AC

## 2022-06-07 NOTE — Progress Notes (Signed)
06/07/2022 Juanantonio TRAVIOUS VANOVER [id number] [birth year]   CHIEF COMPLAINT: Rectal bleeding  HISTORY OF PRESENT ILLNESS: Gianfranco Araki is a 51 year old male with a past medical history of DM type II, hyperlipidemia, chronic tobacco use on Chantix for smoking cessation. He presents to our office today as referred by Dr. Abelino Derrick for further evaluation regarding hematochezia. He passed a bowel movement and he saw a moderate amount of red blood in the toilet he strained a little which occurred approximately 6 weeks ago without recurrence.  He was seen by Dr. Ethelene Hal who recommended further GI evaluation and to schedule a colonoscopy.  He underwent left foot surgery 02/16/2022 and right foot surgery 03/23/2022.  He took Oxycodone as needed for postoperative pain and possibly had mild intermittent constipation which has resolved.  He typically passes a normal formed brown bowel movement once or twice daily.  No significant anorectal pain.  He sometimes vomits if he eats too quickly which last occurred 6 months ago.  He denies having any heartburn.  No upper or lower abdominal pain.  He sometimes has trouble swallowing pills which occurs infrequently.  No difficulty swallowing solids or liquids.  He denies ever having a screening colonoscopy.  No known family history of esophageal, gastric or colon cancer.     Latest Ref Rng & Units 11/26/2021    4:47 PM 01/22/2020    9:12 AM  CBC  WBC 4.0 - 10.5 K/uL 8.4  10.9   Hemoglobin 13.0 - 17.0 g/dL 13.3  13.0   Hematocrit 39.0 - 52.0 % 41.1  39.2   Platelets 150.0 - 400.0 K/uL 209.0  199.0        Latest Ref Rng & Units 11/26/2021    4:47 PM 01/22/2020    9:12 AM  CMP  Glucose 70 - 99 mg/dL 78  101   BUN 6 - 23 mg/dL 8  9   Creatinine 0.40 - 1.50 mg/dL 1.00  0.96   Sodium 135 - 145 mEq/L 141  140   Potassium 3.5 - 5.1 mEq/L 4.1  4.2   Chloride 96 - 112 mEq/L 103  104   CO2 19 - 32 mEq/L 29  29   Calcium 8.4 - 10.5 mg/dL 9.9  9.5   Total  Protein 6.0 - 8.3 g/dL 7.2  6.6   Total Bilirubin 0.2 - 1.2 mg/dL 0.6  0.4   Alkaline Phos 39 - 117 U/L 55  56   AST 0 - 37 U/L 18  15   ALT 0 - 53 U/L 15  12     Social History: He is married.  He has 2 sons and 2 daughters.  He is a Librarian, academic.  He smokes 1 pack of cigarettes daily.  No alcohol use.  No drug use.  Family History: Mother with history of diabetes.  No known family history of esophageal, gastric or colon cancer.  No Known Allergies   Outpatient Encounter Medications as of 06/07/2022  Medication Sig   atorvastatin (LIPITOR) 10 MG tablet Take 1 tablet (10 mg total) by mouth daily.   docusate sodium (COLACE) 100 MG capsule Take 1 capsule (100 mg total) by mouth 2 (two) times daily.   doxycycline (VIBRA-TABS) 100 MG tablet Take 1 tablet (100 mg total) by mouth 2 (two) times daily.   metFORMIN (GLUCOPHAGE-XR) 500 MG 24 hr tablet Take 1 tablet (500 mg total) by mouth 2 (two) times daily with a meal.   ondansetron (ZOFRAN) 4  MG tablet Take 1 tablet (4 mg total) by mouth every 8 (eight) hours as needed for nausea or vomiting.   varenicline (CHANTIX) 1 MG tablet TAKE 1 TABLET BY MOUTH TWICE A DAY   No facility-administered encounter medications on file as of 06/07/2022.   REVIEW OF SYSTEMS:  Gen: Denies fever, sweats or chills. No weight loss.  CV: Denies chest pain, palpitations or edema. Resp: Denies cough, shortness of breath of hemoptysis.  GI: See HPI. GU : Denies urinary burning, blood in urine, increased urinary frequency or incontinence. MS: Denies joint pain, muscles aches or weakness. Derm: Denies rash, itchiness, skin lesions or unhealing ulcers. Psych: Denies depression, anxiety, memory loss, suicidal ideation and confusion. Heme: Denies bruising, easy bleeding. Neuro:  Denies headaches, dizziness or paresthesias. Endo:  DM type II.   PHYSICAL EXAM: BP 130/80   Pulse (!) 117   Ht '6\' 3"'$  (1.905 m)   Wt 194 lb 3.2 oz (88.1 kg)   SpO2 98%   BMI 24.27 kg/m    General: 51 year old male in no acute distress. Head: Normocephalic and atraumatic. Eyes:  Sclerae non-icteric, conjunctive pink. Ears: Normal auditory acuity. Mouth: Dentition intact. No ulcers or lesions.  Neck: Supple, no lymphadenopathy or thyromegaly.  Lungs: Clear bilaterally to auscultation without wheezes, crackles or rhonchi. Heart: Regular rate and rhythm. No murmur, rub or gallop appreciated.  Abdomen: Soft, nontender, non distended. No masses. No hepatosplenomegaly. Normoactive bowel sounds x 4 quadrants.  Rectal: Noninflamed external hemorrhoids.  Internal hemorrhoids without prolapse.  No blood or stool in the rectal vault. Loysie CMA present during exam. Musculoskeletal: Symmetrical with no gross deformities. Skin: Warm and dry. No rash or lesions on visible extremities. Extremities: No edema. Neurological: Alert oriented x 4, no focal deficits.  Psychological:  Alert and cooperative. Normal mood and affect.  ASSESSMENT AND PLAN:  47) 51 year old male with hematochezia.  Noninflamed, nonbleeding internal and external hemorrhoids on exam. -Colonoscopy benefits and risks discussed including risk with sedation, risk of bleeding, perforation and infection  -Benefiber 1 tablespoon daily -MiraLAX nightly as needed -Patient will contact our office if rectal bleeding recurs prior to his colonoscopy date -Recheck CBC if rectal bleeding recurs -Further recommendations to be determined after colonoscopy completed   2) Infrequent pill dysphagia -Patient will monitor dysphagia symptoms, follow-up if symptoms persist or worsen    CC:  Libby Maw,*

## 2022-06-07 NOTE — Patient Instructions (Signed)
You have been scheduled for a colonoscopy. Please follow written instructions given to you at your visit today.  Please pick up your prep supplies at the pharmacy within the next 1-3 days. If you use inhalers (even only as needed), please bring them with you on the day of your procedure.   Benefiber- 1 tablespoon daily  Miralax- every night as needed to avoid straining.  Thank you for trusting me with your gastrointestinal care!   Carl Best, CRNP

## 2022-06-21 ENCOUNTER — Ambulatory Visit: Payer: Managed Care, Other (non HMO) | Admitting: Dietician

## 2022-07-01 ENCOUNTER — Telehealth: Payer: Self-pay | Admitting: Nurse Practitioner

## 2022-07-01 NOTE — Telephone Encounter (Signed)
Prep instructions updated and sent via MyChart.

## 2022-07-01 NOTE — Telephone Encounter (Signed)
Patient rescheduled procedure. Please update prep instructions. 

## 2022-07-19 ENCOUNTER — Encounter: Payer: Managed Care, Other (non HMO) | Admitting: Gastroenterology

## 2022-07-22 ENCOUNTER — Emergency Department (HOSPITAL_BASED_OUTPATIENT_CLINIC_OR_DEPARTMENT_OTHER)
Admission: EM | Admit: 2022-07-22 | Discharge: 2022-07-23 | Disposition: A | Discharge status: Home / Self Care | Payer: Managed Care, Other (non HMO) | Attending: Emergency Medicine | Admitting: Emergency Medicine

## 2022-07-22 ENCOUNTER — Other Ambulatory Visit: Payer: Self-pay

## 2022-07-22 ENCOUNTER — Ambulatory Visit (INDEPENDENT_AMBULATORY_CARE_PROVIDER_SITE_OTHER): Payer: Managed Care, Other (non HMO) | Admitting: Family Medicine

## 2022-07-22 ENCOUNTER — Emergency Department (HOSPITAL_BASED_OUTPATIENT_CLINIC_OR_DEPARTMENT_OTHER): Payer: Managed Care, Other (non HMO)

## 2022-07-22 ENCOUNTER — Encounter (HOSPITAL_BASED_OUTPATIENT_CLINIC_OR_DEPARTMENT_OTHER): Payer: Self-pay | Admitting: Urology

## 2022-07-22 ENCOUNTER — Encounter: Payer: Self-pay | Admitting: Family Medicine

## 2022-07-22 VITALS — BP 120/76 | HR 120 | Temp 98.5°F | Ht 75.0 in | Wt 187.6 lb

## 2022-07-22 DIAGNOSIS — J40 Bronchitis, not specified as acute or chronic: Secondary | ICD-10-CM | POA: Diagnosis not present

## 2022-07-22 DIAGNOSIS — J209 Acute bronchitis, unspecified: Secondary | ICD-10-CM

## 2022-07-22 DIAGNOSIS — E7800 Pure hypercholesterolemia, unspecified: Secondary | ICD-10-CM | POA: Diagnosis not present

## 2022-07-22 DIAGNOSIS — K5901 Slow transit constipation: Secondary | ICD-10-CM | POA: Diagnosis not present

## 2022-07-22 DIAGNOSIS — R000 Tachycardia, unspecified: Secondary | ICD-10-CM | POA: Diagnosis not present

## 2022-07-22 DIAGNOSIS — Z1152 Encounter for screening for COVID-19: Secondary | ICD-10-CM | POA: Diagnosis not present

## 2022-07-22 DIAGNOSIS — R7989 Other specified abnormal findings of blood chemistry: Secondary | ICD-10-CM

## 2022-07-22 DIAGNOSIS — J440 Chronic obstructive pulmonary disease with acute lower respiratory infection: Secondary | ICD-10-CM

## 2022-07-22 DIAGNOSIS — R079 Chest pain, unspecified: Secondary | ICD-10-CM | POA: Diagnosis present

## 2022-07-22 DIAGNOSIS — S161XXA Strain of muscle, fascia and tendon at neck level, initial encounter: Secondary | ICD-10-CM

## 2022-07-22 DIAGNOSIS — I259 Chronic ischemic heart disease, unspecified: Secondary | ICD-10-CM

## 2022-07-22 DIAGNOSIS — E1165 Type 2 diabetes mellitus with hyperglycemia: Secondary | ICD-10-CM | POA: Insufficient documentation

## 2022-07-22 LAB — BASIC METABOLIC PANEL
Anion gap: 10 (ref 5–15)
BUN: 12 mg/dL (ref 6–20)
CO2: 24 mmol/L (ref 22–32)
Calcium: 8.9 mg/dL (ref 8.9–10.3)
Chloride: 101 mmol/L (ref 98–111)
Creatinine, Ser: 0.79 mg/dL (ref 0.61–1.24)
GFR, Estimated: >60 mL/min (ref >60)
Glucose, Bld: 137 mg/dL — ABNORMAL HIGH (ref 70–99)
Potassium: 4.2 mmol/L (ref 3.5–5.1)
Sodium: 135 mmol/L (ref 135–145)

## 2022-07-22 LAB — TROPONIN I (HIGH SENSITIVITY)
Troponin I (High Sensitivity): 2 ng/L (ref <18)
Troponin I (High Sensitivity): 2 ng/L (ref <18)

## 2022-07-22 LAB — RESP PANEL BY RT-PCR (RSV, FLU A&B, COVID)  RVPGX2
Influenza A by PCR: NEGATIVE
Influenza B by PCR: NEGATIVE
Resp Syncytial Virus by PCR: NEGATIVE
SARS Coronavirus 2 by RT PCR: NEGATIVE

## 2022-07-22 LAB — CBC
HCT: 32.8 % — ABNORMAL LOW (ref 39.0–52.0)
Hemoglobin: 10.7 g/dL — ABNORMAL LOW (ref 13.0–17.0)
MCH: 27.6 pg (ref 26.0–34.0)
MCHC: 32.6 g/dL (ref 30.0–36.0)
MCV: 84.8 fL (ref 80.0–100.0)
Platelets: 411 10*3/uL — ABNORMAL HIGH (ref 150–400)
RBC: 3.87 MIL/uL — ABNORMAL LOW (ref 4.22–5.81)
RDW: 13.3 % (ref 11.5–15.5)
WBC: 10.7 10*3/uL — ABNORMAL HIGH (ref 4.0–10.5)
nRBC: 0 % (ref 0.0–0.2)

## 2022-07-22 LAB — GLUCOSE, CAPILLARY: Glucose-Capillary: 131 mg/dL — ABNORMAL HIGH (ref 70–99)

## 2022-07-22 MED ORDER — BENZONATATE 100 MG PO CAPS
200.0000 mg | ORAL_CAPSULE | Freq: Once | ORAL | Status: AC
  Administered 2022-07-22: 200 mg via ORAL
  Filled 2022-07-22: qty 2

## 2022-07-22 MED ORDER — HYDROCODONE BIT-HOMATROP MBR 5-1.5 MG/5ML PO SOLN: 5.0000 mL | Freq: Four times a day (QID) | ORAL | 0 refills | Status: DC | PRN

## 2022-07-22 MED ORDER — ATORVASTATIN CALCIUM 10 MG PO TABS: 10.0000 mg | ORAL_TABLET | Freq: Every day | ORAL | 1 refills | Status: DC

## 2022-07-22 MED ORDER — METHOCARBAMOL 500 MG PO TABS: 500.0000 mg | ORAL_TABLET | Freq: Three times a day (TID) | ORAL | 0 refills | Status: DC | PRN

## 2022-07-22 MED ORDER — AEROCHAMBER PLUS FLO-VU MEDIUM MISC
1.0000 | Freq: Once | Status: AC
  Administered 2022-07-23: 1
  Filled 2022-07-22: qty 10

## 2022-07-22 MED ORDER — ALBUTEROL SULFATE HFA 108 (90 BASE) MCG/ACT IN AERS
2.0000 | INHALATION_SPRAY | Freq: Once | RESPIRATORY_TRACT | Status: AC
  Administered 2022-07-23: 2 via RESPIRATORY_TRACT
  Filled 2022-07-22: qty 6.7

## 2022-07-22 MED ORDER — AZITHROMYCIN 250 MG PO TABS: ORAL_TABLET | ORAL | 0 refills | Status: AC

## 2022-07-22 MED ORDER — AZITHROMYCIN 250 MG PO TABS
500.0000 mg | ORAL_TABLET | Freq: Once | ORAL | Status: AC
  Administered 2022-07-23: 500 mg via ORAL
  Filled 2022-07-22: qty 2

## 2022-07-22 MED ORDER — BENZONATATE 200 MG PO CAPS: 200.0000 mg | ORAL_CAPSULE | Freq: Three times a day (TID) | ORAL | 0 refills | Status: DC | PRN

## 2022-07-22 MED ORDER — AZITHROMYCIN 250 MG PO TABS: ORAL_TABLET | ORAL | 0 refills | Status: DC

## 2022-07-22 MED ORDER — DOCUSATE SODIUM 100 MG PO CAPS: 100.0000 mg | ORAL_CAPSULE | Freq: Two times a day (BID) | ORAL | 0 refills | Status: DC

## 2022-07-22 NOTE — ED Provider Notes (Signed)
[facility] DEPART[facility] Provider Note   CSN: [id number] Arrival date & time: 07/22/22  1747     History  Chief Complaint  Patient presents with   Chest Pain    Edward Downs is a 52 y.o. male.  HPI Patient has several weeks of upper respiratory type symptoms.  Reports a couple weeks ago he was having a lot of nasal congestion and copious nasal drainage.  He reports that he started then getting a headache.  He reports for about a week he was having frontal headaches.  He reports the headaches are not as severe now as they were previously.  He reports at onset of symptoms he thinks he had a fever.  He has not had a fever for the week.  He reports then subsequently he started getting a lot of dry coughing.  He reports now he just has this persistent feeling of a "tickling in his chest".  He denies he has chest pain.  But he reports if he tries to take a deep breath and he starts coughing and it gets uncomfortable.  Patient reports that he had to wait to get into his PCP office for his symptoms.  He was seen today and an EKG was done for tachycardia.  Review of the PCP notes indicate there was concern for ST elevation on the EKG with tachycardia and referral to the emergency department for further evaluation.    Home Medications Prior to Admission medications   Medication Sig Start Date End Date Taking? Authorizing Provider  benzonatate (TESSALON) 200 MG capsule Take 1 capsule (200 mg total) by mouth 3 (three) times daily as needed for cough. 07/22/22  Yes Calysta Craigo, Jeannie Done, MD  HYDROcodone bit-homatropine (HYCODAN) 5-1.5 MG/5ML syrup Take 5 mLs by mouth every 6 (six) hours as needed for cough. 07/22/22  Yes Charlesetta Shanks, MD  atorvastatin (LIPITOR) 10 MG tablet Take 1 tablet (10 mg total) by mouth daily. 07/22/22   Libby Maw, MD  azithromycin (ZITHROMAX) 250 MG tablet First dose given in the emergency department.  1 tablet daily on days 2 through 5 07/23/22  07/27/22  Charlesetta Shanks, MD  docusate sodium (COLACE) 100 MG capsule Take 1 capsule (100 mg total) by mouth 2 (two) times daily. 07/22/22   Libby Maw, MD  doxycycline (VIBRA-TABS) 100 MG tablet Take 1 tablet (100 mg total) by mouth 2 (two) times daily. Patient not taking: Reported on 07/22/2022 05/03/22   Wallene Huh, DPM  metFORMIN (GLUCOPHAGE-XR) 500 MG 24 hr tablet Take 1 tablet (500 mg total) by mouth 2 (two) times daily with a meal. 05/10/22   Libby Maw, MD  methocarbamol (ROBAXIN) 500 MG tablet Take 1 tablet (500 mg total) by mouth every 8 (eight) hours as needed for muscle spasms. 07/22/22   Libby Maw, MD  varenicline (CHANTIX) 1 MG tablet TAKE 1 TABLET BY MOUTH TWICE A DAY Patient not taking: Reported on 07/22/2022 05/20/22   Libby Maw, MD      Allergies    Patient has no known allergies.    Review of Systems   Review of Systems  Physical Exam Updated Vital Signs BP (!) 133/92   Pulse 94   Temp 98.9 F (37.2 C) (Oral)   Resp 16   Ht '6\' 3"'$  (1.905 m)   Wt 85.1 kg   SpO2 96%   BMI 23.45 kg/m  Physical Exam Constitutional:      Comments: Well-nourished well-developed.  Alert nontoxic.  No respiratory distress.  HENT:     Head: Normocephalic and atraumatic.     Comments: No facial tenderness to percussion.    Mouth/Throat:     Mouth: Mucous membranes are moist.     Pharynx: Oropharynx is clear.     Comments: Posterior oropharynx is widely patent.  No erythema or exudate. Eyes:     Extraocular Movements: Extraocular movements intact.     Conjunctiva/sclera: Conjunctivae normal.     Pupils: Pupils are equal, round, and reactive to light.  Cardiovascular:     Rate and Rhythm: Normal rate and regular rhythm.  Pulmonary:     Effort: Pulmonary effort is normal.     Breath sounds: Normal breath sounds.  Abdominal:     General: There is no distension.     Palpations: Abdomen is soft.     Tenderness: There is no abdominal  tenderness. There is no guarding.  Musculoskeletal:        General: No swelling. Normal range of motion.     Cervical back: Neck supple.     Right lower leg: No edema.     Left lower leg: No edema.  Skin:    General: Skin is warm and dry.  Neurological:     General: No focal deficit present.     Mental Status: He is oriented to person, place, and time.     Motor: No weakness.     Coordination: Coordination normal.     ED Results / Procedures / Treatments   Labs (all labs ordered are listed, but only abnormal results are displayed) Labs Reviewed  BASIC METABOLIC PANEL - Abnormal; Notable for the following components:      Result Value   Glucose, Bld 137 (*)    All other components within normal limits  CBC - Abnormal; Notable for the following components:   WBC 10.7 (*)    RBC 3.87 (*)    Hemoglobin 10.7 (*)    HCT 32.8 (*)    Platelets 411 (*)    All other components within normal limits  GLUCOSE, CAPILLARY - Abnormal; Notable for the following components:   Glucose-Capillary 131 (*)    All other components within normal limits  RESP PANEL BY RT-PCR (RSV, FLU A&B, COVID)  RVPGX2  CBG MONITORING, ED  TROPONIN I (HIGH SENSITIVITY)  TROPONIN I (HIGH SENSITIVITY)    EKG EKG Interpretation  Date/Time:  Thursday July 22 2022 18:00:08 EST Ventricular Rate:  117 PR Interval:  170 QRS Duration: 82 QT Interval:  300 QTC Calculation: 418 R Axis:   72 Text Interpretation: Sinus tachycardia Minimal voltage criteria for LVH, may be normal variant ( Sokolow-Lyon ) early repolarization No previous ECGs available Confirmed by Charlesetta Shanks [phone]) on 07/22/2022 9:30:26 PM  Radiology DG Chest 2 View  Result Date: 07/22/2022 CLINICAL DATA:  Tachycardia and chest pain starting Monday. Mild shortness of breath. Fatigue. EXAM: CHEST - 2 VIEW COMPARISON:  08/16/2016 FINDINGS: Shallow inspiration. The heart size and mediastinal contours are within normal limits. Both lungs are  clear. The visualized skeletal structures are unremarkable. IMPRESSION: No active cardiopulmonary disease. Electronically Signed   By: Lucienne Capers M.D.   On: 07/22/2022 18:21    Procedures Procedures    Medications Ordered in ED Medications  azithromycin (ZITHROMAX) tablet 500 mg (has no administration in time range)  albuterol (VENTOLIN HFA) 108 (90 Base) MCG/ACT inhaler 2 puff (has no administration in time range)  AeroChamber Plus Flo-Vu Medium MISC 1 each (has no  administration in time range)  benzonatate (TESSALON) capsule 200 mg (200 mg Oral Given 07/22/22 2151)    ED Course/ Medical Decision Making/ A&P                             Medical Decision Making Amount and/or Complexity of Data Reviewed Labs: ordered. Radiology: ordered.  Risk Prescription drug management.   Patient is referred for further evaluation.  He describes several weeks of upper respiratory symptoms followed by significant cough and bronchitis type symptoms.  He denies chest pain but reports he gets a lot of coughing episodes with a deep breath.  At the office today EKG was tachycardic.  Will proceed with diagnostic evaluation to rule out ACS and for persistent respiratory symptoms.  EKG reviewed by myself.  There is diffuse ST elevation with tachycardia this appears most likely early repolarization effect with tachycardia.  No STEMI.  No old comparison on file.  Troponin returns at 2, repeat flat at 2.  Chest x-ray reviewed by radiology no acute findings.  No cardiomegaly, no consolidations or congestion.  COVID and influenza testing negative.  At this time without patient endorsing chest pain, no appreciable rub or murmur on exam, flat troponins and EKG suggestive of diffuse repolarization I have low suspicion for cardiac ischemia.  Patient describes chest pain associated with coughing.  Plan from prior provider today was to initiate Zithromax.  Will proceed with a dose of Zithromax and also provide  albuterol inhaler and Tessalon Perles as well as Hycodan syrup to use on outpatient basis for cough.  At this time I feel patient is stable to follow-up for additional outpatient testing including possibly echocardiogram if needed.  At this time I do not think there is additional emergent pathology that needs hospital admission or further diagnostic imaging tonight.  Return precautions reviewed and patient aware of need to return.  All tests and results as well as planning reviewed with patient's wife at bedside.        Final Clinical Impression(s) / ED Diagnoses Final diagnoses:  Bronchitis  Tachycardia    Rx / DC Orders ED Discharge Orders          Ordered    azithromycin (ZITHROMAX) 250 MG tablet        07/22/22 2350    benzonatate (TESSALON) 200 MG capsule  3 times daily PRN        07/22/22 2350    HYDROcodone bit-homatropine (HYCODAN) 5-1.5 MG/5ML syrup  Every 6 hours PRN        07/22/22 2350              Charlesetta Shanks, MD 07/23/22 0011

## 2022-07-22 NOTE — Progress Notes (Signed)
Established Patient Office Visit   Subjective:  Patient ID: Edward Downs, male    DOB: 04-19-1971  Age: 52 y.o. MRN: [id number]  Chief Complaint  Patient presents with   Headache    Neck spasms with headaches symptoms x 2 weeks     Headache  Associated symptoms include coughing, nausea and neck pain. Pertinent negatives include no abdominal pain, blurred vision, eye redness, tingling or weakness.   Encounter Diagnoses  Name Primary?   Tachycardia Yes   Elevated cholesterol    Slow transit constipation    Low TSH level    Type 2 diabetes mellitus with hyperglycemia, without long-term current use of insulin (HCC)    Strain of neck muscle, initial encounter    Acute bronchitis with COPD ([place])    Chest pain due to myocardial ischemia, unspecified ischemic chest pain type    Reports a 2-week history of URI symptoms to include malaise nasal congestion postnasal drip and cough.  There is also been tightness in his posterior neck that is moved up into his head area.  Denies wheezing but has experienced tightness in his chest associated with shortness of breath nausea and some sweating.  During this time he and his wife have moved into their first house.  They moved themselves.  He has been experiencing above symptoms during the moving process.  Recent diagnosis of type 2 diabetes, elevated cholesterol and tobacco use.  Recent labs did show a depressed TSH.  He has been losing weight.   Review of Systems  Constitutional:  Positive for diaphoresis.  HENT:  Positive for congestion.   Eyes:  Negative for blurred vision, discharge and redness.  Respiratory:  Positive for cough and shortness of breath. Negative for wheezing.   Cardiovascular:  Positive for chest pain.  Gastrointestinal:  Positive for nausea. Negative for abdominal pain.  Genitourinary: Negative.   Musculoskeletal:  Positive for neck pain.  Skin:  Negative for rash.  Neurological:  Positive for headaches. Negative for  tingling, loss of consciousness and weakness.  Endo/Heme/Allergies:  Negative for polydipsia.     Current Outpatient Medications:    azithromycin (ZITHROMAX) 250 MG tablet, Take 2 tablets on day 1, then 1 tablet daily on days 2 through 5, Disp: 6 tablet, Rfl: 0   metFORMIN (GLUCOPHAGE-XR) 500 MG 24 hr tablet, Take 1 tablet (500 mg total) by mouth 2 (two) times daily with a meal., Disp: 60 tablet, Rfl: 2   methocarbamol (ROBAXIN) 500 MG tablet, Take 1 tablet (500 mg total) by mouth every 8 (eight) hours as needed for muscle spasms., Disp: 30 tablet, Rfl: 0   atorvastatin (LIPITOR) 10 MG tablet, Take 1 tablet (10 mg total) by mouth daily., Disp: 90 tablet, Rfl: 1   docusate sodium (COLACE) 100 MG capsule, Take 1 capsule (100 mg total) by mouth 2 (two) times daily., Disp: 10 capsule, Rfl: 0   doxycycline (VIBRA-TABS) 100 MG tablet, Take 1 tablet (100 mg total) by mouth 2 (two) times daily. (Patient not taking: Reported on 07/22/2022), Disp: 20 tablet, Rfl: 1   varenicline (CHANTIX) 1 MG tablet, TAKE 1 TABLET BY MOUTH TWICE A DAY (Patient not taking: Reported on 07/22/2022), Disp: 180 tablet, Rfl: 1   Objective:     BP 120/76 (BP Location: Right Arm, Patient Position: Sitting, Cuff Size: Normal)   Pulse (!) 120   Temp 98.5 F (36.9 C)   Ht '6\' 3"'$  (1.905 m)   Wt 187 lb 9.6 oz (85.1 kg)  SpO2 97%   BMI 23.45 kg/m    Physical Exam Constitutional:      General: He is not in acute distress.    Appearance: Normal appearance. He is not ill-appearing, toxic-appearing or diaphoretic.  HENT:     Head: Normocephalic and atraumatic.     Right Ear: External ear normal.     Left Ear: External ear normal.     Mouth/Throat:     Mouth: Mucous membranes are moist.     Pharynx: Oropharynx is clear. No oropharyngeal exudate or posterior oropharyngeal erythema.  Eyes:     General: No scleral icterus.       Right eye: No discharge.        Left eye: No discharge.     Extraocular Movements: Extraocular  movements intact.     Conjunctiva/sclera: Conjunctivae normal.     Pupils: Pupils are equal, round, and reactive to light.  Cardiovascular:     Rate and Rhythm: Regular rhythm. Tachycardia present.  Pulmonary:     Effort: Pulmonary effort is normal. No respiratory distress.     Breath sounds: Normal breath sounds.  Abdominal:     General: Bowel sounds are normal.  Musculoskeletal:     Cervical back: No rigidity or tenderness.  Skin:    General: Skin is warm and dry.     Findings: No rash.  Neurological:     Mental Status: He is alert and oriented to person, place, and time.  Psychiatric:        Mood and Affect: Mood normal.        Behavior: Behavior normal.      No results found for any visits on 07/22/22.    The 10-year ASCVD risk score (Arnett DK, et al., 2019) is: 15.4%    Assessment & Plan:   Tachycardia -     EKG 12-Lead -     T3, free; Future -     T4, free; Future  Elevated cholesterol -     Atorvastatin Calcium; Take 1 tablet (10 mg total) by mouth daily.  Dispense: 90 tablet; Refill: 1  Slow transit constipation -     Docusate Sodium; Take 1 capsule (100 mg total) by mouth 2 (two) times daily.  Dispense: 10 capsule; Refill: 0  Low TSH level -     T3, free; Future -     T4, free; Future  Type 2 diabetes mellitus with hyperglycemia, without long-term current use of insulin (HCC)  Strain of neck muscle, initial encounter -     Methocarbamol; Take 1 tablet (500 mg total) by mouth every 8 (eight) hours as needed for muscle spasms.  Dispense: 30 tablet; Refill: 0  Acute bronchitis with COPD ([place]) -     Azithromycin; Take 2 tablets on day 1, then 1 tablet daily on days 2 through 5  Dispense: 6 tablet; Refill: 0  Chest pain due to myocardial ischemia, unspecified ischemic chest pain type    Return Go to emergency room now.  Follow-up next week for blood draws for thyroid hormone levels..  Patient does admit shortness of breath with nausea and tightness in  his chest associated with physical activity.  EKG today does show a tachycardia with ST segment elevation in the inferior lateral leads.  Will proceed to emergency room for evaluation.  Zithromax for bronchitis.  Robaxin for neck strain.  Follow-up next week for free T3 and T4 levels.  Libby Maw, MD

## 2022-07-22 NOTE — Discharge Instructions (Signed)
1.  You were given your first dose of Zithromax in the emergency department.  Start your prescription tomorrow night.  Take 1 tab daily tomorrow night and then continue 1 tab nightly. 2.  You have been given an inhaler from the emergency department.  You may use this as instructed every 4-6 hours for wheezing or coughing.  You may take Tessalon Perles for cough.  Swallow them whole.  Do not chew them or suck on them.  These will not cause sedation.  You have been given Hycodan syrup to take for harsh cough.  Do not drive or do any dangerous activities if you take this medication.  It may make you drowsy or dizzy. 3.  Follow-up with your doctor for recheck.  Return to the emergency department immediately if you have new worsening or concerning symptoms.

## 2022-07-22 NOTE — ED Triage Notes (Signed)
Sent by PCP for low TSH, tachycardia and chest pain  Pt states central chest pain that started Monday  States mild SOB as well and easily fatigued with exertion  NAD noted at this time

## 2022-07-26 ENCOUNTER — Other Ambulatory Visit (INDEPENDENT_AMBULATORY_CARE_PROVIDER_SITE_OTHER): Payer: Managed Care, Other (non HMO)

## 2022-07-26 ENCOUNTER — Ambulatory Visit: Payer: Managed Care, Other (non HMO) | Admitting: Family Medicine

## 2022-07-26 ENCOUNTER — Telehealth: Payer: Self-pay

## 2022-07-26 DIAGNOSIS — R000 Tachycardia, unspecified: Secondary | ICD-10-CM

## 2022-07-26 DIAGNOSIS — R7989 Other specified abnormal findings of blood chemistry: Secondary | ICD-10-CM

## 2022-07-26 LAB — T3, FREE: T3, Free: 3.7 pg/mL (ref 2.3–4.2)

## 2022-07-26 LAB — T4, FREE: Free T4: 1 ng/dL (ref 0.60–1.60)

## 2022-07-26 NOTE — Telephone Encounter (Signed)
Transition Care Management Follow-up Telephone Call Date of discharge and from where: 07/23/22 [facility] ED. Dx: Bronchitis How have you been since you were released from the hospital? I'm doing better Any questions or concerns? No  Items Reviewed: Did the pt receive and understand the discharge instructions provided? Yes  Medications obtained and verified? Yes  Other? No  Any new allergies since your discharge? No  Dietary orders reviewed? No Do you have support at home? Yes     Follow up appointments reviewed:  PCP Hospital f/u appt confirmed? Yes  Scheduled to see Dr. Ethelene Hal on 07/29/22 @ 3:40pm. [facility] f/u appt confirmed? No  Scheduled to see n/a on n/a @ n/a. Are transportation arrangements needed? No  If their condition worsens, is the pt aware to call PCP or go to the Emergency Dept.? Yes Was the patient provided with contact information for the PCP's office or ED? Yes Was to pt encouraged to call back with questions or concerns? Yes  Angeline Slim, RN, BSN RN Clinical Supervisor [facility] [employer]

## 2022-07-29 ENCOUNTER — Encounter: Payer: Self-pay | Admitting: Family Medicine

## 2022-07-29 ENCOUNTER — Ambulatory Visit (INDEPENDENT_AMBULATORY_CARE_PROVIDER_SITE_OTHER): Payer: Managed Care, Other (non HMO) | Admitting: Family Medicine

## 2022-07-29 VITALS — BP 116/70 | HR 114 | Temp 98.6°F | Ht 75.0 in | Wt 187.8 lb

## 2022-07-29 DIAGNOSIS — J209 Acute bronchitis, unspecified: Secondary | ICD-10-CM | POA: Diagnosis not present

## 2022-07-29 DIAGNOSIS — E1165 Type 2 diabetes mellitus with hyperglycemia: Secondary | ICD-10-CM

## 2022-07-29 DIAGNOSIS — J440 Chronic obstructive pulmonary disease with acute lower respiratory infection: Secondary | ICD-10-CM

## 2022-07-29 DIAGNOSIS — R000 Tachycardia, unspecified: Secondary | ICD-10-CM | POA: Diagnosis not present

## 2022-07-29 MED ORDER — BLOOD GLUCOSE TEST VI STRP: 1.0000 | ORAL_STRIP | Freq: Three times a day (TID) | 0 refills | Status: AC

## 2022-07-29 MED ORDER — LANCETS MISC. MISC: 1.0000 | Freq: Three times a day (TID) | 0 refills | Status: DC

## 2022-07-29 MED ORDER — LANCET DEVICE MISC: 1.0000 | Freq: Three times a day (TID) | 0 refills | Status: DC

## 2022-07-29 MED ORDER — BLOOD GLUCOSE MONITORING SUPPL DEVI: 1.0000 | Freq: Three times a day (TID) | 0 refills | Status: AC

## 2022-07-29 NOTE — Progress Notes (Signed)
Established Patient Office Visit   Subjective:  Patient ID: Edward Downs, male    DOB: Jul 06, 1970  Age: 52 y.o. MRN: [id number]  Chief Complaint  Patient presents with   Hospitalization [facility] follow up would like glucose meter for home    HPI Encounter Diagnoses  Name Primary?   Type 2 diabetes mellitus with hyperglycemia, without long-term current use of insulin (HCC) Yes   Tachycardia    Acute bronchitis with COPD ([place])    For follow-up status post ER visit for acute bronchitis and elevated heart rate with ST segment elevation.  Emergency rheum workup was negative for acute coronary syndrome.  Bronchitis has resolved.  He is still experiencing some mild wheezing.  He continues to use his albuterol inhaler.  He denies any ongoing fevers, chest pain, shortness of breath nausea or pleurodynia.  He is feeling better.  T3 and T4 levels were normal with depressed TSH level.   Review of Systems  Constitutional: Negative.  Negative for chills and fever.  HENT: Negative.    Eyes:  Negative for blurred vision, discharge and redness.  Respiratory:  Positive for cough and wheezing. Negative for hemoptysis, sputum production and shortness of breath.   Cardiovascular: Negative.  Negative for chest pain.  Gastrointestinal:  Negative for abdominal pain.  Genitourinary: Negative.   Musculoskeletal: Negative.  Negative for myalgias.  Skin:  Negative for rash.  Neurological:  Negative for tingling, loss of consciousness and weakness.  Endo/Heme/Allergies:  Negative for polydipsia.     Current Outpatient Medications:    atorvastatin (LIPITOR) 10 MG tablet, Take 1 tablet (10 mg total) by mouth daily., Disp: 90 tablet, Rfl: 1   Blood Glucose Monitoring Suppl DEVI, 1 each by Does not apply route in the morning, at noon, and at bedtime. May substitute to any manufacturer covered by patient's insurance., Disp: 1 each, Rfl: 0   docusate sodium (COLACE) 100 MG capsule, Take 1  capsule (100 mg total) by mouth 2 (two) times daily., Disp: 10 capsule, Rfl: 0   Glucose Blood (BLOOD GLUCOSE TEST STRIPS) STRP, 1 each by In Vitro route in the morning, at noon, and at bedtime. May substitute to any manufacturer covered by patient's insurance., Disp: 100 strip, Rfl: 0   Lancet Device MISC, 1 each by Does not apply route in the morning, at noon, and at bedtime. May substitute to any manufacturer covered by patient's insurance., Disp: 1 each, Rfl: 0   Lancets Misc. MISC, 1 each by Does not apply route in the morning, at noon, and at bedtime. May substitute to any manufacturer covered by patient's insurance., Disp: 100 each, Rfl: 0   metFORMIN (GLUCOPHAGE-XR) 500 MG 24 hr tablet, Take 1 tablet (500 mg total) by mouth 2 (two) times daily with a meal., Disp: 60 tablet, Rfl: 2   HYDROcodone bit-homatropine (HYCODAN) 5-1.5 MG/5ML syrup, Take 5 mLs by mouth every 6 (six) hours as needed for cough. (Patient not taking: Reported on 07/29/2022), Disp: 120 mL, Rfl: 0   methocarbamol (ROBAXIN) 500 MG tablet, Take 1 tablet (500 mg total) by mouth every 8 (eight) hours as needed for muscle spasms. (Patient not taking: Reported on 07/29/2022), Disp: 30 tablet, Rfl: 0   varenicline (CHANTIX) 1 MG tablet, TAKE 1 TABLET BY MOUTH TWICE A DAY (Patient not taking: Reported on 07/22/2022), Disp: 180 tablet, Rfl: 1   Objective:     BP 116/70 (BP Location: Left Arm, Patient Position: Sitting, Cuff Size: Normal)  Pulse (!) 114   Temp 98.6 F (37 C) (Temporal)   Ht '6\' 3"'$  (1.905 m)   Wt 187 lb 12.8 oz (85.2 kg)   SpO2 97%   BMI 23.47 kg/m    Physical Exam Constitutional:      General: He is not in acute distress.    Appearance: Normal appearance. He is not ill-appearing, toxic-appearing or diaphoretic.  HENT:     Head: Normocephalic and atraumatic.     Right Ear: External ear normal.     Left Ear: External ear normal.     Mouth/Throat:     Mouth: Mucous membranes are moist.     Pharynx:  Oropharynx is clear. No oropharyngeal exudate or posterior oropharyngeal erythema.  Eyes:     General: No scleral icterus.       Right eye: No discharge.        Left eye: No discharge.     Extraocular Movements: Extraocular movements intact.     Conjunctiva/sclera: Conjunctivae normal.     Pupils: Pupils are equal, round, and reactive to light.  Cardiovascular:     Rate and Rhythm: Regular rhythm. Tachycardia present.  Pulmonary:     Effort: Pulmonary effort is normal. No respiratory distress.     Breath sounds: Wheezing present.     Comments: Faint end expiratory wheezes throughout. Abdominal:     General: Bowel sounds are normal.     Tenderness: There is no abdominal tenderness. There is no guarding.  Musculoskeletal:     Cervical back: No rigidity or tenderness.  Skin:    General: Skin is warm and dry.  Neurological:     Mental Status: He is alert and oriented to person, place, and time.  Psychiatric:        Mood and Affect: Mood normal.        Behavior: Behavior normal.      No results found for any visits on 07/29/22.    The 10-year ASCVD risk score (Arnett DK, et al., 2019) is: 14.5%    Assessment & Plan:   Type 2 diabetes mellitus with hyperglycemia, without long-term current use of insulin (HCC) -     Blood Glucose Monitoring Suppl; 1 each by Does not apply route in the morning, at noon, and at bedtime. May substitute to any manufacturer covered by patient's insurance.  Dispense: 1 each; Refill: 0 -     Blood Glucose Test; 1 each by In Vitro route in the morning, at noon, and at bedtime. May substitute to any manufacturer covered by patient's insurance.  Dispense: 100 strip; Refill: 0 -     Lancet Device; 1 each by Does not apply route in the morning, at noon, and at bedtime. May substitute to any manufacturer covered by patient's insurance.  Dispense: 1 each; Refill: 0 -     Lancets Misc.; 1 each by Does not apply route in the morning, at noon, and at bedtime. May  substitute to any manufacturer covered by patient's insurance.  Dispense: 100 each; Refill: 0  Tachycardia  Acute bronchitis with COPD ([place]) -     CBC with Differential/Platelet    Return in about 2 weeks (around 08/12/2022).  Pulse rate is still elevated.  He is asymptomatic.  He continues to use the albuterol inhaler appropriately.  Will recheck in a couple of weeks.  Libby Maw, MD

## 2022-07-30 LAB — CBC WITH DIFFERENTIAL/PLATELET
Basophils Absolute: 0.1 10*3/uL (ref 0.0–0.1)
Basophils Relative: 1.3 % (ref 0.0–3.0)
Eosinophils Absolute: 0.4 10*3/uL (ref 0.0–0.7)
Eosinophils Relative: 4.3 % (ref 0.0–5.0)
HCT: 34.2 % — ABNORMAL LOW (ref 39.0–52.0)
Hemoglobin: 11.3 g/dL — ABNORMAL LOW (ref 13.0–17.0)
Lymphocytes Relative: 37.2 % (ref 12.0–46.0)
Lymphs Abs: 3.2 10*3/uL (ref 0.7–4.0)
MCHC: 33.1 g/dL (ref 30.0–36.0)
MCV: 86.1 fl (ref 78.0–100.0)
Monocytes Absolute: 0.6 10*3/uL (ref 0.1–1.0)
Monocytes Relative: 7.1 % (ref 3.0–12.0)
Neutro Abs: 4.3 10*3/uL (ref 1.4–7.7)
Neutrophils Relative %: 50.1 % (ref 43.0–77.0)
Platelets: 384 10*3/uL (ref 150.0–400.0)
RBC: 3.97 Mil/uL — ABNORMAL LOW (ref 4.22–5.81)
RDW: 14.9 % (ref 11.5–15.5)
WBC: 8.5 10*3/uL (ref 4.0–10.5)

## 2022-08-03 ENCOUNTER — Ambulatory Visit: Payer: Managed Care, Other (non HMO) | Admitting: Skilled Nursing Facility1

## 2022-08-05 ENCOUNTER — Other Ambulatory Visit: Payer: Self-pay | Admitting: Family Medicine

## 2022-08-05 DIAGNOSIS — E1165 Type 2 diabetes mellitus with hyperglycemia: Secondary | ICD-10-CM

## 2022-08-10 ENCOUNTER — Encounter: Payer: Self-pay | Admitting: Skilled Nursing Facility1

## 2022-08-10 ENCOUNTER — Encounter: Payer: Managed Care, Other (non HMO) | Attending: Family Medicine | Admitting: Skilled Nursing Facility1

## 2022-08-10 DIAGNOSIS — E1165 Type 2 diabetes mellitus with hyperglycemia: Secondary | ICD-10-CM | POA: Diagnosis present

## 2022-08-10 NOTE — Progress Notes (Signed)
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  Goals: Floss the teeth every day. Try to sleep at least 6 hours.  Keep doing all the changes that you are ready doing it, like not choosing fried food. Drink plain water during the day Check sugar if you feel shaky, tired, sweating, or lightheaded. Blood sugar should he above 80 mg   Diabetes Self-Management Education  Visit Type: First/Initial   08/10/2022  [name], identified by name and date of birth, is a 52 y.o. male with a diagnosis of Diabetes: Type 2.   ASSESSMENT  There were no vitals taken for this visit. There is no height or weight on file to calculate BMI.   Diabetes Self-Management Education - 08/10/22 1145       Visit Information   Visit Type First/Initial      Initial Visit   Diabetes Type Type 2    Date Diagnosed october 2023    Are you currently following a meal plan? No    Are you taking your medications as prescribed? Yes      Health Coping   How would you rate your overall health? Fair      Psychosocial Assessment   Patient Belief/Attitude about Diabetes Motivated to manage diabetes    What is the hardest part about your diabetes right now, causing you the most concern, or is the most worrisome to you about your diabetes?   Making healty food and beverage choices    Self-care barriers None    Self-management support Family    Other persons present Spouse/SO    Patient Concerns Nutrition/Meal planning;Weight Control    Preferred Learning Style Hands on    How often do you need to have someone help you when you read instructions, pamphlets, or other written materials from your doctor or pharmacy? 1 - Never      Pre-Education Assessment   Patient understands the diabetes disease and  treatment process. Needs Instruction    Patient understands incorporating nutritional management into lifestyle. Needs Instruction    Patient undertands incorporating physical activity into lifestyle. Needs Instruction    Patient understands using medications safely. Needs Instruction    Patient understands monitoring blood glucose, interpreting and using results Needs Instruction    Patient understands prevention, detection, and treatment of acute complications. Needs Instruction    Patient understands prevention, detection, and treatment of chronic complications. Needs Instruction    Patient understands how to develop strategies to address psychosocial issues. Needs Instruction    Patient understands how to develop strategies to promote health/change behavior. Needs Instruction      Complications   Last HgB A1C per patient/outside source 11.8 %    Fasting Blood glucose range (mg/dL) 70-129    Postprandial Blood glucose range (mg/dL) 130-179    Number of hypoglycemic episodes per month 0    Have you had a dilated eye exam in the past 12 months? No    Have you had a dental exam in the past 12 months? No    Are you checking your feet? Yes    How many days per week are you checking your feet? 7      Dietary Intake   Breakfast Cereal, milk, bluebeerys and banana, coffee with cremmer  Lunch skip 2-3 times a week .Sandwich 1/2    Dinner steak, brocolli, onio, mushrooms and rice    Beverage(s) Power 8  zero and water, start zero      Activity / Exercise   Activity / Exercise Type Light (walking / raking leaves)      Patient Education   Previous Diabetes Education No    Disease Pathophysiology Definition of diabetes, type 1 and 2, and the diagnosis of diabetes;Factors that contribute to the development of diabetes    Healthy Eating Plate Method;Carbohydrate counting;Role of diet in the treatment of diabetes and the relationship between the three main macronutrients and blood glucose  level;Information on hints to eating out and maintain blood glucose control.    Being Active Role of exercise on diabetes management, blood pressure control and cardiac health.    Medications Taught/reviewed insulin/injectables, injection, site rotation, insulin/injectables storage and needle disposal.;Reviewed patients medication for diabetes, action, purpose, timing of dose and side effects.    Monitoring Yearly dilated eye exam;Daily foot exams;Identified appropriate SMBG and/or A1C goals.;Interpreting lab values - A1C, lipid, urine microalbumina.;Taught/evaluated SMBG meter.    Acute complications Taught prevention, symptoms, and  treatment of hypoglycemia - the 15 rule.;Discussed and identified patients' prevention, symptoms, and treatment of hyperglycemia.    Chronic complications Nephropathy, what it is, prevention of, the use of ACE, ARB's and early detection of through urine microalbumia.;Retinopathy and reason for yearly dilated eye exams;Dental care    Diabetes Stress and Support Role of stress on diabetes;Worked with patient to identify barriers to care and solutions    Lifestyle and Health Coping Review risk of smoking and offered smoking cessation;Lifestyle issues that need to be addressed for better diabetes care      Individualized Goals (developed by patient)   Nutrition Follow meal plan discussed;General guidelines for healthy choices and portions discussed    Physical Activity Exercise 5-7 days per week;60 minutes per day    Medications take my medication as prescribed    Monitoring  Test my blood glucose as discussed;Test blood glucose pre and post meals as discussed    Problem Solving Eating Pattern;Addressing barriers to behavior change    Reducing Risk stop smoking;do foot checks daily;treat hypoglycemia with 15 grams of carbs if blood glucose less than '70mg'$ /dL      Post-Education Assessment   Patient understands the diabetes disease and treatment process. Demonstrates  understanding / competency    Patient understands incorporating nutritional management into lifestyle. Demonstrates understanding / competency    Patient undertands incorporating physical activity into lifestyle. Demonstrates understanding / competency    Patient understands using medications safely. Demonstrates understanding / competency    Patient understands monitoring blood glucose, interpreting and using results Demonstrates understanding / competency    Patient understands prevention, detection, and treatment of acute complications. Demonstrates understanding / competency    Patient understands prevention, detection, and treatment of chronic complications. Demonstrates understanding / competency    Patient understands how to develop strategies to address psychosocial issues. Demonstrates understanding / competency    Patient understands how to develop strategies to promote health/change behavior. Demonstrates understanding / competency      Outcomes   Expected Outcomes Demonstrated interest in learning. Expect positive outcomes    Future DMSE 3-4 months    Program Status Completed             Individualized Plan for Diabetes Self-Management Training:   Learning Objective:  Patient will have a greater understanding of diabetes self-management. Patient education  plan is to attend individual and/or group sessions per assessed needs and concerns.    Expected Outcomes:  Demonstrated interest in learning. Expect positive outcomes  Education material provided: ADA - How to Thrive: A Guide for Your Journey with Diabetes, Food label handouts, Meal plan card, My Plate, and Carbohydrate counting sheet  If problems or questions, patient to contact team via:  Phone and Email  Future DSME appointment: 3-4 months

## 2022-08-12 ENCOUNTER — Ambulatory Visit (INDEPENDENT_AMBULATORY_CARE_PROVIDER_SITE_OTHER): Payer: Managed Care, Other (non HMO) | Admitting: Family Medicine

## 2022-08-12 ENCOUNTER — Encounter: Payer: Self-pay | Admitting: Family Medicine

## 2022-08-12 VITALS — BP 120/72 | HR 89 | Temp 98.0°F | Ht 75.0 in | Wt 189.6 lb

## 2022-08-12 DIAGNOSIS — E1165 Type 2 diabetes mellitus with hyperglycemia: Secondary | ICD-10-CM | POA: Diagnosis not present

## 2022-08-12 DIAGNOSIS — R000 Tachycardia, unspecified: Secondary | ICD-10-CM

## 2022-08-12 DIAGNOSIS — D649 Anemia, unspecified: Secondary | ICD-10-CM

## 2022-08-12 NOTE — Progress Notes (Signed)
Established Patient Office Visit   Subjective:  Patient ID: Edward Downs, male    DOB: 1971-03-26  Age: 52 y.o. MRN: [id number]  Chief Complaint  Patient presents with   Follow-up    Follow up on diabetes no concerns.     HPI Encounter Diagnoses  Name Primary?   Type 2 diabetes mellitus with hyperglycemia, without long-term current use of insulin (HCC) Yes   Tachycardia    Normocytic anemia    For follow-up of new onset of type 2 diabetes with hyperglycemia.  Doing well with the metformin.  Fasting blood sugars have been in the less than 120 range.  He is exercising.  He is mindful of the carbohydrates in his diet.  He has been working with the diabetic nutritionist.  It turns out that he has been consuming a large amount of energy drinks.  Recent blood work showed a normocytic anemia.   Review of Systems  Constitutional: Negative.   HENT: Negative.    Eyes:  Negative for blurred vision, discharge and redness.  Respiratory: Negative.    Cardiovascular: Negative.   Gastrointestinal:  Negative for abdominal pain and blood in stool.  Genitourinary: Negative.  Negative for frequency and hematuria.  Musculoskeletal: Negative.  Negative for myalgias.  Skin:  Negative for rash.  Neurological:  Negative for tingling, loss of consciousness and weakness.  Endo/Heme/Allergies:  Negative for polydipsia.     Current Outpatient Medications:    Accu-Chek Softclix Lancets lancets, 1 each 3 (three) times daily., Disp: , Rfl:    atorvastatin (LIPITOR) 10 MG tablet, Take 1 tablet (10 mg total) by mouth daily., Disp: 90 tablet, Rfl: 1   Blood Glucose Monitoring Suppl DEVI, 1 each by Does not apply route in the morning, at noon, and at bedtime. May substitute to any manufacturer covered by patient's insurance., Disp: 1 each, Rfl: 0   docusate sodium (COLACE) 100 MG capsule, Take 1 capsule (100 mg total) by mouth 2 (two) times daily., Disp: 10 capsule, Rfl: 0   Glucose Blood (BLOOD GLUCOSE  TEST STRIPS) STRP, 1 each by In Vitro route in the morning, at noon, and at bedtime. May substitute to any manufacturer covered by patient's insurance., Disp: 100 strip, Rfl: 0   metFORMIN (GLUCOPHAGE-XR) 500 MG 24 hr tablet, TAKE 1 TABLET BY MOUTH 2 TIMES DAILY WITH A MEAL., Disp: 180 tablet, Rfl: 1   HYDROcodone bit-homatropine (HYCODAN) 5-1.5 MG/5ML syrup, Take 5 mLs by mouth every 6 (six) hours as needed for cough. (Patient not taking: Reported on 07/29/2022), Disp: 120 mL, Rfl: 0   Objective:     BP 120/72 (BP Location: Right Arm, Patient Position: Sitting, Cuff Size: Normal)   Pulse 89   Temp 98 F (36.7 C) (Temporal)   Ht '6\' 3"'$  (1.905 m)   Wt 189 lb 9.6 oz (86 kg)   SpO2 98%   BMI 23.70 kg/m    Physical Exam Constitutional:      General: He is not in acute distress.    Appearance: Normal appearance. He is not ill-appearing, toxic-appearing or diaphoretic.  HENT:     Head: Normocephalic and atraumatic.     Right Ear: External ear normal.     Left Ear: External ear normal.  Eyes:     General: No scleral icterus.       Right eye: No discharge.        Left eye: No discharge.     Extraocular Movements: Extraocular movements intact.  Conjunctiva/sclera: Conjunctivae normal.  Pulmonary:     Effort: Pulmonary effort is normal. No respiratory distress.  Skin:    General: Skin is warm and dry.  Neurological:     Mental Status: He is alert and oriented to person, place, and time.  Psychiatric:        Mood and Affect: Mood normal.        Behavior: Behavior normal.      No results found for any visits on 08/12/22.    The 10-year ASCVD risk score (Arnett DK, et al., 2019) is: 15.4%    Assessment & Plan:   Type 2 diabetes mellitus with hyperglycemia, without long-term current use of insulin (HCC) -     Basic metabolic panel -     Hemoglobin A1c -     Microalbumin / creatinine urine ratio  Tachycardia  Normocytic anemia -     B12 and Folate Panel -     Iron,  TIBC and Ferritin Panel    Return in about 3 months (around 11/10/2022).  Rechecking hemoglobin A1c.  Believe that energy drinks could have been partially responsible for his tachycardia.  Will check B12 folate and iron levels regarding normocytic anemia.  Libby Maw, MD

## 2022-08-13 LAB — BASIC METABOLIC PANEL
BUN: 10 mg/dL (ref 6–23)
CO2: 29 mEq/L (ref 19–32)
Calcium: 9.8 mg/dL (ref 8.4–10.5)
Chloride: 104 mEq/L (ref 96–112)
Creatinine, Ser: 0.86 mg/dL (ref 0.40–1.50)
GFR: 101.73 mL/min (ref >60.00)
Glucose, Bld: 60 mg/dL — ABNORMAL LOW (ref 70–99)
Potassium: 4.4 mEq/L (ref 3.5–5.1)
Sodium: 141 mEq/L (ref 135–145)

## 2022-08-13 LAB — IRON,TIBC AND FERRITIN PANEL
%SAT: 34 % (calc) (ref 20–48)
Ferritin: 61 ng/mL (ref 38–380)
Iron: 110 ug/dL (ref 50–180)
TIBC: 327 mcg/dL (calc) (ref 250–425)

## 2022-08-13 LAB — B12 AND FOLATE PANEL
Folate: 12.1 ng/mL (ref >5.9)
Vitamin B-12: 579 pg/mL (ref 211–911)

## 2022-08-13 LAB — MICROALBUMIN / CREATININE URINE RATIO
Creatinine,U: 53.9 mg/dL
Microalb Creat Ratio: 1.3 mg/g (ref 0.0–30.0)
Microalb, Ur: <0.7 mg/dL (ref 0.0–1.9)

## 2022-08-13 LAB — HEMOGLOBIN A1C: Hgb A1c MFr Bld: 5.9 % (ref 4.6–6.5)

## 2022-08-19 ENCOUNTER — Encounter: Payer: Self-pay | Admitting: Gastroenterology

## 2022-08-26 ENCOUNTER — Encounter: Payer: Self-pay | Admitting: Certified Registered Nurse Anesthetist

## 2022-08-27 ENCOUNTER — Encounter: Payer: Self-pay | Admitting: Gastroenterology

## 2022-08-27 ENCOUNTER — Ambulatory Visit: Payer: Managed Care, Other (non HMO) | Admitting: Gastroenterology

## 2022-08-27 VITALS — BP 119/71 | HR 86 | Temp 98.6°F | Resp 14

## 2022-08-27 DIAGNOSIS — D123 Benign neoplasm of transverse colon: Secondary | ICD-10-CM

## 2022-08-27 DIAGNOSIS — K625 Hemorrhage of anus and rectum: Secondary | ICD-10-CM | POA: Diagnosis not present

## 2022-08-27 MED ORDER — SODIUM CHLORIDE 0.9 % IV SOLN: 500.0000 mL | Freq: Once | INTRAVENOUS | Status: DC

## 2022-08-27 NOTE — Progress Notes (Unsigned)
History and Physical:  This patient presents for endoscopic testing for: Encounter Diagnosis  Name Primary?   Rectal bleeding Yes    Clinical details in 06/07/22 office consult note.  Self-limited rectal bleeding during a period of constipation.  Patient is otherwise without complaints or active issues today.   Past Medical History: Past Medical History:  Diagnosis Date   COPD (chronic obstructive pulmonary disease) ([place])    Diabetes ([place])    Pustular psoriasis of palms and soles 2020     Past Surgical History: Past Surgical History:  Procedure Laterality Date   FOOT SURGERY     KNEE ARTHROSCOPY  1990    Allergies: No Known Allergies  Outpatient Meds: Current Outpatient Medications  Medication Sig Dispense Refill   Accu-Chek Softclix Lancets lancets 1 each 3 (three) times daily.     atorvastatin (LIPITOR) 10 MG tablet Take 1 tablet (10 mg total) by mouth daily. 90 tablet 1   Blood Glucose Monitoring Suppl DEVI 1 each by Does not apply route in the morning, at noon, and at bedtime. May substitute to any manufacturer covered by patient's insurance. 1 each 0   Glucose Blood (BLOOD GLUCOSE TEST STRIPS) STRP 1 each by In Vitro route in the morning, at noon, and at bedtime. May substitute to any manufacturer covered by patient's insurance. 100 strip 0   metFORMIN (GLUCOPHAGE-XR) 500 MG 24 hr tablet TAKE 1 TABLET BY MOUTH 2 TIMES DAILY WITH A MEAL. 180 tablet 1   docusate sodium (COLACE) 100 MG capsule Take 1 capsule (100 mg total) by mouth 2 (two) times daily. 10 capsule 0   HYDROcodone bit-homatropine (HYCODAN) 5-1.5 MG/5ML syrup Take 5 mLs by mouth every 6 (six) hours as needed for cough. (Patient not taking: Reported on 07/29/2022) 120 mL 0   Current Facility-Administered Medications  Medication Dose Route Frequency Provider Last Rate Last Admin   0.9 %  sodium chloride infusion  500 mL Intravenous Once Danis, Estill Cotta III, MD           ___________________________________________________________________ Objective   Exam:  Temp 98.6 F (37 C)   CV: regular , S1/S2 Resp: clear to auscultation bilaterally, normal RR and effort noted GI: soft, no tenderness, with active bowel sounds.   Assessment: Encounter Diagnosis  Name Primary?   Rectal bleeding Yes     Plan: Colonoscopy   The patient is appropriate for an endoscopic procedure in the ambulatory setting.   - Wilfrid Lund, MD

## 2022-08-27 NOTE — Progress Notes (Unsigned)
Called to room to assist during endoscopic procedure.  Patient ID and intended procedure confirmed with present staff. Received instructions for my participation in the procedure from the performing physician.  

## 2022-08-27 NOTE — Patient Instructions (Signed)
Resume previous diet and medications. Awaiting pathology results. Repeat Colonoscopy date to be determined based on pathology. Handouts provided on colon polyps  YOU HAD AN ENDOSCOPIC PROCEDURE TODAY AT [facility]:   Refer to the procedure report that was given to you for any specific questions about what was found during the examination.  If the procedure report does not answer your questions, please call your gastroenterologist to clarify.  If you requested that your care partner not be given the details of your procedure findings, then the procedure report has been included in a sealed envelope for you to review at your convenience later.  YOU SHOULD EXPECT: Some feelings of bloating in the abdomen. Passage of more gas than usual.  Walking can help get rid of the air that was put into your GI tract during the procedure and reduce the bloating. If you had a lower endoscopy (such as a colonoscopy or flexible sigmoidoscopy) you may notice spotting of blood in your stool or on the toilet paper. If you underwent a bowel prep for your procedure, you may not have a normal bowel movement for a few days.  Please Note:  You might notice some irritation and congestion in your nose or some drainage.  This is from the oxygen used during your procedure.  There is no need for concern and it should clear up in a day or so.  SYMPTOMS TO REPORT IMMEDIATELY:  Following lower endoscopy (colonoscopy or flexible sigmoidoscopy):  Excessive amounts of blood in the stool  Significant tenderness or worsening of abdominal pains  Swelling of the abdomen that is new, acute  Fever of 100F or higher  For urgent or emergent issues, a gastroenterologist can be reached at any hour by calling [phone]. Do not use MyChart messaging for urgent concerns.    DIET:  We do recommend a small meal at first, but then you may proceed to your regular diet.  Drink plenty of fluids but you should avoid alcoholic  beverages for 24 hours.  ACTIVITY:  You should plan to take it easy for the rest of today and you should NOT DRIVE or use heavy machinery until tomorrow (because of the sedation medicines used during the test).    FOLLOW UP: Our staff will call the number listed on your records the next business day following your procedure.  We will call around 7:15- 8:00 am to check on you and address any questions or concerns that you may have regarding the information given to you following your procedure. If we do not reach you, we will leave a message.     If any biopsies were taken you will be contacted by phone or by letter within the next 1-3 weeks.  Please call us at [phone] if you have not heard about the biopsies in 3 weeks.    SIGNATURES/CONFIDENTIALITY: You and/or your care partner have signed paperwork which will be entered into your electronic medical record.  These signatures attest to the fact that that the information above on your After Visit Summary has been reviewed and is understood.  Full responsibility of the confidentiality of this discharge information lies with you and/or your care-partner.

## 2022-08-27 NOTE — Progress Notes (Signed)
Pt's states no medical or surgical changes since previsit or office visit. 

## 2022-08-27 NOTE — Progress Notes (Signed)
Report given to PACU, vss 

## 2022-08-27 NOTE — Op Note (Signed)
[place] Patient Name: Edward Downs Procedure Date: 08/27/2022 3:46 PM MRN: [id number] Endoscopist: [place]. Loletha Carrow , MD, [id number] Age: 52 Referring MD:  Date of Birth: 16-Oct-1970 Gender: Male Account #: [id number] Procedure:                Colonoscopy Indications:              Rectal bleeding (self-limited episode during a                            period of constipation) Medicines:                Monitored Anesthesia Care Procedure:                Pre-Anesthesia Assessment:                           - Prior to the procedure, a History and Physical                            was performed, and patient medications and                            allergies were reviewed. The patient's tolerance of                            previous anesthesia was also reviewed. The risks                            and benefits of the procedure and the sedation                            options and risks were discussed with the patient.                            All questions were answered, and informed consent                            was obtained. Prior Anticoagulants: The patient has                            taken no anticoagulant or antiplatelet agents. ASA                            Grade Assessment: II - A patient with mild systemic                            disease. After reviewing the risks and benefits,                            the patient was deemed in satisfactory condition to                            undergo the procedure.  After obtaining informed consent, the colonoscope                            was passed under direct vision. Throughout the                            procedure, the patient's blood pressure, pulse, and                            oxygen saturations were monitored continuously. The                            CF [id number] was introduced through the anus                            and advanced to the the cecum,  identified by                            appendiceal orifice and ileocecal valve. The                            colonoscopy was performed without difficulty. The                            patient tolerated the procedure well. The quality                            of the bowel preparation was good. The ileocecal                            valve, appendiceal orifice, and rectum were                            photographed. The bowel preparation used was SUPREP                            via split dose instruction. Scope In: 4:34:14 PM Scope Out: 4:47:44 PM Scope Withdrawal Time: 0 hours 11 minutes 2 seconds  Total Procedure Duration: 0 hours 13 minutes 30 seconds  Findings:                 The perianal and digital rectal examinations were                            normal.                           Repeat examination of right colon under NBI                            performed.                           A 5 mm polyp was found in the proximal transverse  colon. The polyp was sessile. The polyp was removed                            with a cold snare. Resection and retrieval were                            complete.                           Internal hemorrhoids were found.                           The exam was otherwise without abnormality on                            direct and retroflexion views. Complications:            No immediate complications. Estimated Blood Loss:     Estimated blood loss was minimal. Impression:               - One 5 mm polyp in the proximal transverse colon,                            removed with a cold snare. Resected and retrieved.                           - Internal hemorrhoids.                           - The examination was otherwise normal on direct                            and retroflexion views. Recommendation:           - Patient has a contact number available for                            emergencies. The signs and  symptoms of potential                            delayed complications were discussed with the                            patient. Return to normal activities tomorrow.                            Written discharge instructions were provided to the                            patient.                           - Resume previous diet.                           - Continue present medications.                           -  Await pathology results.                           - Repeat colonoscopy is recommended for                            surveillance. The colonoscopy date will be                            determined after pathology results from today's                            exam become available for review.                           - For bowel regularity: Several servings of fruits                            and vegetables daily, sufficient water intake,                            physical activity. Avoid sitting for prolonged                            periods on the toilet or straining with bowel                            movements. Latricia Cerrito L. Loletha Carrow, MD 08/27/2022 4:52:33 PM This report has been signed electronically.

## 2022-08-30 ENCOUNTER — Telehealth: Payer: Self-pay

## 2022-08-30 NOTE — Telephone Encounter (Signed)
  Follow up Call-     08/27/2022    2:16 PM  Call back number  Post procedure Call Back phone  # [phone]  Permission to leave phone message Yes     Patient questions:  Do you have a fever, pain , or abdominal swelling? No. Pain Score  0 *  Have you tolerated food without any problems? Yes.    Have you been able to return to your normal activities? Yes.    Do you have any questions about your discharge instructions: Diet   No. Medications  No. Follow up visit  No.  Do you have questions or concerns about your Care? No.  Actions: * If pain score is 4 or above: No action needed, pain <4.

## 2022-09-01 ENCOUNTER — Encounter: Payer: Self-pay | Admitting: Gastroenterology

## 2022-10-08 ENCOUNTER — Other Ambulatory Visit: Payer: Self-pay

## 2022-10-08 ENCOUNTER — Telehealth: Payer: Self-pay | Admitting: Family Medicine

## 2022-10-08 DIAGNOSIS — B351 Tinea unguium: Secondary | ICD-10-CM

## 2022-10-08 NOTE — Telephone Encounter (Signed)
Returned patients call regarding requested referral , no answer LMTCB

## 2022-10-08 NOTE — Telephone Encounter (Signed)
Caller Name: Trevell Call back phone #: [phone]  Reason for Call: Pt would like a referral to a podiatrist. He was unsure if you had spoken with him about this.

## 2022-10-08 NOTE — Telephone Encounter (Signed)
Spoke with patient requested referral place patient aware someone will give him a call to schedule appointment

## 2022-10-11 ENCOUNTER — Ambulatory Visit: Payer: Managed Care, Other (non HMO) | Admitting: Skilled Nursing Facility1

## 2022-10-22 ENCOUNTER — Ambulatory Visit: Payer: Managed Care, Other (non HMO) | Admitting: Podiatry

## 2022-10-27 ENCOUNTER — Ambulatory Visit: Payer: Managed Care, Other (non HMO) | Admitting: Podiatry

## 2022-10-29 ENCOUNTER — Encounter: Payer: Self-pay | Admitting: Podiatry

## 2022-10-29 ENCOUNTER — Ambulatory Visit (INDEPENDENT_AMBULATORY_CARE_PROVIDER_SITE_OTHER): Payer: Managed Care, Other (non HMO) | Admitting: Podiatry

## 2022-10-29 DIAGNOSIS — B351 Tinea unguium: Secondary | ICD-10-CM | POA: Diagnosis not present

## 2022-10-29 MED ORDER — TERBINAFINE HCL 250 MG PO TABS: 250.0000 mg | ORAL_TABLET | Freq: Every day | ORAL | 0 refills | Status: DC

## 2022-11-01 NOTE — Progress Notes (Signed)
Subjective:   Patient ID: Edward Downs, male   DOB: 52 y.o.   MRN: [id number]   HPI Patient states he has been getting a lot of itching between his digits of his toes and it has been irritated.  States he is doing great after the surgery we did last year everything feeling much better but he does get a lot of itching between his digits with some nail disease also noted   ROS      Objective:  Physical Exam  Neurovascular status intact excellent alignment from previous surgery with redness around the skin and irritation between the digits bilateral     Assessment:  Fungal infection bilateral with also digital deformity that has been well corrected     Plan:  H&P reviewed he did have blood work done recently which was all normal and I recommended Lamisil for 60 days.  I did explain taking 1 a day if any issues were to occur let me know and hopefully this will help the skin and possibly the nail disease present.  Patient is written prescription will be seen back depending on recovery

## 2022-11-10 ENCOUNTER — Encounter: Payer: Self-pay | Admitting: Family Medicine

## 2022-11-10 ENCOUNTER — Other Ambulatory Visit: Payer: Self-pay | Admitting: Family Medicine

## 2022-11-10 ENCOUNTER — Ambulatory Visit: Payer: Managed Care, Other (non HMO) | Admitting: Family Medicine

## 2022-11-10 VITALS — BP 130/76 | HR 90 | Temp 98.3°F | Ht 75.0 in | Wt 191.2 lb

## 2022-11-10 DIAGNOSIS — Z720 Tobacco use: Secondary | ICD-10-CM

## 2022-11-10 DIAGNOSIS — E7800 Pure hypercholesterolemia, unspecified: Secondary | ICD-10-CM

## 2022-11-10 DIAGNOSIS — R7303 Prediabetes: Secondary | ICD-10-CM | POA: Diagnosis not present

## 2022-11-10 LAB — LIPID PANEL
Cholesterol: 149 mg/dL (ref 0–200)
HDL: 39.5 mg/dL (ref >39.00)
NonHDL: 109.38
Total CHOL/HDL Ratio: 4
Triglycerides: 204 mg/dL — ABNORMAL HIGH (ref 0.0–149.0)
VLDL: 40.8 mg/dL — ABNORMAL HIGH (ref 0.0–40.0)

## 2022-11-10 LAB — HEMOGLOBIN A1C: Hgb A1c MFr Bld: 5.8 % (ref 4.6–6.5)

## 2022-11-10 LAB — BASIC METABOLIC PANEL
BUN: 9 mg/dL (ref 6–23)
CO2: 28 mEq/L (ref 19–32)
Calcium: 9.7 mg/dL (ref 8.4–10.5)
Chloride: 103 mEq/L (ref 96–112)
Creatinine, Ser: 0.97 mg/dL (ref 0.40–1.50)
GFR: 91.56 mL/min (ref >60.00)
Glucose, Bld: 102 mg/dL — ABNORMAL HIGH (ref 70–99)
Potassium: 4.5 mEq/L (ref 3.5–5.1)
Sodium: 140 mEq/L (ref 135–145)

## 2022-11-10 LAB — LDL CHOLESTEROL, DIRECT: Direct LDL: 79 mg/dL

## 2022-11-10 MED ORDER — VARENICLINE TARTRATE 0.5 MG PO TABS
ORAL_TABLET | ORAL | 0 refills | Status: DC
Start: 2022-11-10 — End: 2022-12-06

## 2022-11-10 MED ORDER — VARENICLINE TARTRATE 0.5 MG PO TABS
ORAL_TABLET | ORAL | 0 refills | Status: DC
Start: 2022-11-10 — End: 2022-11-10

## 2022-11-10 NOTE — Progress Notes (Signed)
Established Patient Office Visit   Subjective:  Patient ID: Edward Downs, male    DOB: 30-Jan-1971  Age: 52 y.o. MRN: [id number]  Chief Complaint  Patient presents with   Medical Management of Chronic Issues    3 month follow, no concerns. Patient fasting.     HPI Encounter Diagnoses  Name Primary?   Prediabetes Yes   Tobacco abuse disorder    Elevated cholesterol    For follow-up of above.  Would like to try Chantix for smoking cessation.  He is taking in the past and it was helpful.  He had no untoward side effects but only used it briefly.  He was definitely able to cut down on his smoking.  He is down to about a third a pack a day but smokes in the morning soon after waking.  No issues with atorvastatin or metformin.  Hemoglobin A1c is fallen into the prediabetic range.   Review of Systems  Constitutional: Negative.   HENT: Negative.    Eyes:  Negative for blurred vision, discharge and redness.  Respiratory: Negative.    Cardiovascular: Negative.   Gastrointestinal:  Negative for abdominal pain.  Genitourinary: Negative.   Musculoskeletal: Negative.  Negative for myalgias.  Skin:  Negative for rash.  Neurological:  Negative for tingling, loss of consciousness and weakness.  Endo/Heme/Allergies:  Negative for polydipsia.      11/10/2022   11:00 AM 07/22/2022    3:59 PM 05/10/2022    1:52 PM  Depression screen PHQ 2/9  Decreased Interest 0 0 0  Down, Depressed, Hopeless 0 0 0  PHQ - 2 Score 0 0 0      Current Outpatient Medications:    Accu-Chek Softclix Lancets lancets, 1 each 3 (three) times daily., Disp: , Rfl:    atorvastatin (LIPITOR) 10 MG tablet, Take 1 tablet (10 mg total) by mouth daily., Disp: 90 tablet, Rfl: 1   Blood Glucose Monitoring Suppl DEVI, 1 each by Does not apply route in the morning, at noon, and at bedtime. May substitute to any manufacturer covered by patient's insurance., Disp: 1 each, Rfl: 0   metFORMIN (GLUCOPHAGE-XR) 500 MG 24 hr  tablet, TAKE 1 TABLET BY MOUTH 2 TIMES DAILY WITH A MEAL., Disp: 180 tablet, Rfl: 1   terbinafine (LAMISIL) 250 MG tablet, Take 1 tablet (250 mg total) by mouth daily., Disp: 60 tablet, Rfl: 0   varenicline (CHANTIX) 0.5 MG tablet, Take 1 tablet (0.5 mg total) by mouth 2 (two) times daily for 3 days, THEN 1 tablet (0.5 mg total) 2 (two) times daily for 4 days, THEN 2 tablets (1 mg total) 2 (two) times daily. Pick a quit date and start 1 week prior to that date.., Disp: 134 tablet, Rfl: 0   Objective:     BP 130/76 (BP Location: Right Arm, Patient Position: Sitting, Cuff Size: Normal)   Pulse 90   Temp 98.3 F (36.8 C) (Temporal)   Ht 6\' 3"  (1.905 m)   Wt 191 lb 3.2 oz (86.7 kg)   SpO2 98%   BMI 23.90 kg/m  BP Readings from Last 3 Encounters:  11/10/22 130/76  08/27/22 119/71  08/12/22 120/72   Wt Readings from Last 3 Encounters:  11/10/22 191 lb 3.2 oz (86.7 kg)  08/12/22 189 lb 9.6 oz (86 kg)  07/29/22 187 lb 12.8 oz (85.2 kg)      Physical Exam Constitutional:      General: He is not in acute distress.  Appearance: Normal appearance. He is not ill-appearing, toxic-appearing or diaphoretic.  HENT:     Head: Normocephalic and atraumatic.     Right Ear: External ear normal.     Left Ear: External ear normal.  Eyes:     General: No scleral icterus.       Right eye: No discharge.        Left eye: No discharge.     Extraocular Movements: Extraocular movements intact.     Conjunctiva/sclera: Conjunctivae normal.  Cardiovascular:     Rate and Rhythm: Normal rate and regular rhythm.  Pulmonary:     Effort: Pulmonary effort is normal. No respiratory distress.     Breath sounds: Normal breath sounds.  Skin:    General: Skin is warm and dry.  Neurological:     Mental Status: He is alert and oriented to person, place, and time.  Psychiatric:        Mood and Affect: Mood normal.        Behavior: Behavior normal.      No results found for any visits on  11/10/22.    The 10-year ASCVD risk score (Arnett DK, et al., 2019) is: 17.5%    Assessment & Plan:   Prediabetes -     Basic metabolic panel -     Hemoglobin A1c  Tobacco abuse disorder -     Varenicline Tartrate; Take 1 tablet (0.5 mg total) by mouth 2 (two) times daily for 3 days, THEN 1 tablet (0.5 mg total) 2 (two) times daily for 4 days, THEN 2 tablets (1 mg total) 2 (two) times daily. Pick a quit date and start 1 week prior to that date..  Dispense: 134 tablet; Refill: 0  Elevated cholesterol -     Lipid panel    Return in about 5 weeks (around 12/15/2022).  Continue atorvastatin and metformin.  Will change his diagnosis to prediabetes.  Discussed using Chantix for 2 to 3 months for best outcomes.  He will pick a quit date and start the medication 1 week prior.  Information on Chantix was given.  Mliss Sax, MD

## 2022-11-10 NOTE — Addendum Note (Signed)
Addended by: Nadene Rubins A on: 11/10/2022 11:59 AM   Modules accepted: Orders

## 2022-12-04 ENCOUNTER — Other Ambulatory Visit: Payer: Self-pay | Admitting: Family Medicine

## 2022-12-04 DIAGNOSIS — Z720 Tobacco use: Secondary | ICD-10-CM

## 2022-12-23 ENCOUNTER — Other Ambulatory Visit: Payer: Self-pay | Admitting: Podiatry

## 2022-12-30 NOTE — Telephone Encounter (Signed)
Should not be refilled.

## 2023-01-03 NOTE — Telephone Encounter (Signed)
Not sure why you sent this. I said not to refill

## 2023-01-03 NOTE — Telephone Encounter (Signed)
why

## 2023-01-03 NOTE — Telephone Encounter (Signed)
No idea why I keep receiving this

## 2023-01-05 NOTE — Telephone Encounter (Signed)
Why do I keep getting this message

## 2023-01-07 ENCOUNTER — Ambulatory Visit (INDEPENDENT_AMBULATORY_CARE_PROVIDER_SITE_OTHER): Payer: Managed Care, Other (non HMO) | Admitting: Internal Medicine

## 2023-01-07 ENCOUNTER — Encounter: Payer: Self-pay | Admitting: Internal Medicine

## 2023-01-07 VITALS — BP 120/80 | HR 95 | Temp 97.8°F | Ht 75.0 in | Wt 189.0 lb

## 2023-01-07 DIAGNOSIS — M791 Myalgia, unspecified site: Secondary | ICD-10-CM | POA: Diagnosis not present

## 2023-01-07 LAB — CBC WITH DIFFERENTIAL/PLATELET
Basophils Absolute: 0.1 10*3/uL (ref 0.0–0.1)
Basophils Relative: 0.8 % (ref 0.0–3.0)
Eosinophils Absolute: 0.2 10*3/uL (ref 0.0–0.7)
Eosinophils Relative: 2.5 % (ref 0.0–5.0)
HCT: 41.6 % (ref 39.0–52.0)
Hemoglobin: 13.1 g/dL (ref 13.0–17.0)
Lymphocytes Relative: 18.5 % (ref 12.0–46.0)
Lymphs Abs: 1.6 10*3/uL (ref 0.7–4.0)
MCHC: 31.5 g/dL (ref 30.0–36.0)
MCV: 87.5 fl (ref 78.0–100.0)
Monocytes Absolute: 1.6 10*3/uL — ABNORMAL HIGH (ref 0.1–1.0)
Monocytes Relative: 18.6 % — ABNORMAL HIGH (ref 3.0–12.0)
Neutro Abs: 5 10*3/uL (ref 1.4–7.7)
Neutrophils Relative %: 59.6 % (ref 43.0–77.0)
Platelets: 200 10*3/uL (ref 150.0–400.0)
RBC: 4.76 Mil/uL (ref 4.22–5.81)
RDW: 14 % (ref 11.5–15.5)
WBC: 8.5 10*3/uL (ref 4.0–10.5)

## 2023-01-07 LAB — POC URINALSYSI DIPSTICK (AUTOMATED)
Bilirubin, UA: NEGATIVE
Blood, UA: NEGATIVE
Glucose, UA: NEGATIVE
Ketones, UA: NEGATIVE
Nitrite, UA: NEGATIVE
Protein, UA: NEGATIVE
Spec Grav, UA: 1.015 (ref 1.010–1.025)
Urobilinogen, UA: 1 E.U./dL
pH, UA: 6 (ref 5.0–8.0)

## 2023-01-07 LAB — COMPREHENSIVE METABOLIC PANEL
ALT: 46 U/L (ref 0–53)
AST: 38 U/L — ABNORMAL HIGH (ref 0–37)
Albumin: 4.1 g/dL (ref 3.5–5.2)
Alkaline Phosphatase: 57 U/L (ref 39–117)
BUN: 8 mg/dL (ref 6–23)
CO2: 28 mEq/L (ref 19–32)
Calcium: 9.6 mg/dL (ref 8.4–10.5)
Chloride: 101 mEq/L (ref 96–112)
Creatinine, Ser: 1.03 mg/dL (ref 0.40–1.50)
GFR: 85.1 mL/min (ref >60.00)
Glucose, Bld: 117 mg/dL — ABNORMAL HIGH (ref 70–99)
Potassium: 4.5 mEq/L (ref 3.5–5.1)
Sodium: 137 mEq/L (ref 135–145)
Total Bilirubin: 0.6 mg/dL (ref 0.2–1.2)
Total Protein: 6.9 g/dL (ref 6.0–8.3)

## 2023-01-07 NOTE — Patient Instructions (Signed)
Drink plenty of fluids

## 2023-01-07 NOTE — Progress Notes (Signed)
[facility] [address] [place] [address] Dept: [phone] Dept Fax: [phone]  Acute Care Office Visit  Subjective:   Edward Downs September 25, 1970 01/07/2023  Chief Complaint  Patient presents with   Headache    Muscle aches, yesterday muscle cramps, dizziness, fatigue taking Tylenol yesterday morning 143/90 evening120/83  blood glucose reading 102     HPI: Discussed the use of AI scribe software for clinical note transcription with the patient, who gave verbal consent to proceed.  History of Present Illness   The patient, with a history of diabetes, presents with generalized muscle aches and fatigue. They report a recent trip to their hometown where they indulged in foods they don't typically consume, including chicken, cabbage, and banana pudding. Upon returning, they resumed their normal diet and hydration routine, which includes two meals a day, water, Powerade Zeros, and occasionally a Pepsi Zero. Despite no changes in their physical activity, they began experiencing headaches, a sensation of heaviness in their body, and generalized muscle soreness. They also reported a cramp in their hand. The symptoms have been ongoing for three days, with today being the best they've felt since the onset. They also reported a brief episode of feeling nauseous and a strange sensation in their mouth when eating. They denied fever, abdominal pain, and changes in urine color. They reported a single episode of blood in their stool two weeks ago, which has not recurred. Their blood sugars have been well controlled, ranging from 99 to 107.      The following portions of the patient's history were reviewed and updated as appropriate: past medical history, past surgical history, family history, social history, allergies, medications, and problem list.   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   Cervical radiculopathy at C8 02/09/2021   Penile lesion 02/09/2021   Condylomata acuminata in male 08/22/2020   Mixed hyperlipidemia 03/04/2020   Tobacco abuse disorder 01/22/2020   History of asthma 01/22/2020   Screen for STD (sexually transmitted disease) 01/22/2020   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     atorvastatin (LIPITOR) 10 MG tablet Take 1 tablet (10 mg total) by mouth daily. 90 tablet 1   Blood Glucose Monitoring Suppl DEVI 1 each by Does not apply route in the morning, at noon, and at bedtime. May substitute to any manufacturer covered by patient's  insurance. 1 each 0   metFORMIN (GLUCOPHAGE-XR) 500 MG 24 hr tablet TAKE 1 TABLET BY MOUTH 2 TIMES DAILY WITH A MEAL. (Patient taking differently: Take 500 mg by mouth daily.) 180 tablet 1   terbinafine (LAMISIL) 250 MG tablet Take 1 tablet (250 mg total) by mouth daily. 60 tablet 0   varenicline (CHANTIX) 1 MG tablet Take 1 tablet (1 mg total) by mouth 2 (two) times  daily. 60 tablet 2   No facility-administered medications prior to visit.   No Known Allergies   ROS: A complete ROS was performed with pertinent positives/negatives noted in the HPI. The remainder of the ROS are negative.    Objective:   Today's Vitals   01/07/23 1045  BP: 120/80  Pulse: 95  Temp: 97.8 F (36.6 C)  TempSrc: Temporal  SpO2: 98%  Weight: 189 lb (85.7 kg)  Height: 6\' 3"  (1.905 m)    Physical Exam   GENERAL:  no acute distress, well appearing. CHEST: Lungs clear to auscultation bilaterally, respirations even and non labored. NECK: No lymphadenopathy CARDIOVASCULAR: S1, S2 present, normal rate and rhythm, strong peripheral pulses bilaterally. EXTREMITIES: Without edema, cyanosis or clubbing. NEUROLOGICAL: Alert and oriented x4 . SKIN: Pink, warm and dry, no lesions or rashes. Moist mucus membranes.      No results found for any visits on 01/07/23.    Assessment & Plan:  Assessment and Plan    Generalized muscle aches and fatigue: Recent onset of generalized muscle aches, fatigue, and headaches. No fever, nausea, vomiting, diarrhea, or constipation. No significant changes in urine color or volume. No abdominal pain. No respiratory symptoms. No edema. Possible recent dietary changes and potential dehydration. -Order CBC, CMP, and urine dipstick to assess for infection, electrolyte abnormalities, kidney injury, and potential rhabdomyolysis. -Instructed to increase fluid intake. -Provide work note for today. -Results will be communicated as soon as available, and further management will be based on these results.      No orders of the defined types were placed in this encounter.  Lab Orders         CBC with Differential/Platelet         Comp Met (CMET)         POCT Urinalysis Dipstick (Automated)     No images are attached to the encounter or orders placed in the encounter.  Return if symptoms worsen or fail to improve.   Of note, portions of this note  may have been created with voice recognition software Physicist, medical). While this note has been edited for accuracy, occasional wrong-word or 'sound-a-like' substitutions may have occurred due to the inherent limitations of voice recognition software.   Salvatore Decent, FNP

## 2023-01-10 ENCOUNTER — Other Ambulatory Visit: Payer: Self-pay | Admitting: Internal Medicine

## 2023-01-10 DIAGNOSIS — D72821 Monocytosis (symptomatic): Secondary | ICD-10-CM

## 2023-01-10 NOTE — Telephone Encounter (Signed)
Why?  

## 2023-01-24 ENCOUNTER — Other Ambulatory Visit (INDEPENDENT_AMBULATORY_CARE_PROVIDER_SITE_OTHER): Payer: Managed Care, Other (non HMO)

## 2023-01-24 DIAGNOSIS — D72821 Monocytosis (symptomatic): Secondary | ICD-10-CM | POA: Diagnosis not present

## 2023-01-24 LAB — CBC WITH DIFFERENTIAL/PLATELET
Basophils Absolute: 0.1 10*3/uL (ref 0.0–0.1)
Basophils Relative: 1 % (ref 0.0–3.0)
Eosinophils Absolute: 0.4 10*3/uL (ref 0.0–0.7)
Eosinophils Relative: 3.3 % (ref 0.0–5.0)
HCT: 40.8 % (ref 39.0–52.0)
Hemoglobin: 13 g/dL (ref 13.0–17.0)
Lymphocytes Relative: 17 % (ref 12.0–46.0)
Lymphs Abs: 2.2 10*3/uL (ref 0.7–4.0)
MCHC: 31.9 g/dL (ref 30.0–36.0)
MCV: 87.9 fl (ref 78.0–100.0)
Monocytes Absolute: 1.1 10*3/uL — ABNORMAL HIGH (ref 0.1–1.0)
Monocytes Relative: 8.7 % (ref 3.0–12.0)
Neutro Abs: 8.9 10*3/uL — ABNORMAL HIGH (ref 1.4–7.7)
Neutrophils Relative %: 70 % (ref 43.0–77.0)
Platelets: 254 10*3/uL (ref 150.0–400.0)
RBC: 4.64 Mil/uL (ref 4.22–5.81)
RDW: 14.2 % (ref 11.5–15.5)
WBC: 12.7 10*3/uL — ABNORMAL HIGH (ref 4.0–10.5)

## 2023-04-30 ENCOUNTER — Other Ambulatory Visit: Payer: Self-pay | Admitting: Family Medicine

## 2023-04-30 DIAGNOSIS — E7800 Pure hypercholesterolemia, unspecified: Secondary | ICD-10-CM

## 2023-05-03 ENCOUNTER — Encounter: Payer: Self-pay | Admitting: Family Medicine

## 2023-05-03 ENCOUNTER — Ambulatory Visit (INDEPENDENT_AMBULATORY_CARE_PROVIDER_SITE_OTHER): Payer: BC Managed Care – PPO | Admitting: Family Medicine

## 2023-05-03 VITALS — BP 114/78 | HR 89 | Temp 98.0°F | Ht 75.0 in | Wt 185.2 lb

## 2023-05-03 DIAGNOSIS — Z720 Tobacco use: Secondary | ICD-10-CM | POA: Diagnosis not present

## 2023-05-03 DIAGNOSIS — R7303 Prediabetes: Secondary | ICD-10-CM | POA: Diagnosis not present

## 2023-05-03 DIAGNOSIS — E7800 Pure hypercholesterolemia, unspecified: Secondary | ICD-10-CM

## 2023-05-03 MED ORDER — ATORVASTATIN CALCIUM 10 MG PO TABS
10.0000 mg | ORAL_TABLET | Freq: Every day | ORAL | 1 refills | Status: AC
Start: 2023-05-03 — End: ?

## 2023-05-03 MED ORDER — METFORMIN HCL ER 500 MG PO TB24: 500.0000 mg | ORAL_TABLET | Freq: Two times a day (BID) | ORAL | 1 refills | Status: DC

## 2023-05-03 NOTE — Progress Notes (Signed)
Established Patient Office Visit   Subjective:  Patient ID: Edward Downs, male    DOB: 09-05-1970  Age: 52 y.o. MRN: [id number]  Chief Complaint  Patient presents with   Medical Management of Chronic Issues    Follow up fasting. Pt requesting medication refills.     HPI Encounter Diagnoses  Name Primary?   Elevated cholesterol Yes   Tobacco abuse disorder    Prediabetes    For follow-up of above.  Turns out that he never started the Chantix for smoking cessation.  Continues atorvastatin and metformin for elevated cholesterol and prediabetes.  He is now an International aid/development worker at [employer].  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   Review of Systems  Constitutional: Negative.   HENT: Negative.    Eyes:  Negative for blurred vision, discharge and redness.  Respiratory: Negative.    Cardiovascular: Negative.   Gastrointestinal:  Negative for abdominal pain.  Genitourinary: Negative.   Musculoskeletal: Negative.  Negative for myalgias.  Skin:  Negative for rash.  Neurological:  Negative for tingling, loss of consciousness and weakness.  Endo/Heme/Allergies:  Negative for polydipsia.     Current Outpatient Medications:    varenicline (CHANTIX) 1 MG tablet, Take 1 tablet (1 mg total) by mouth 2 (two) times daily., Disp: 60 tablet, Rfl: 2   Accu-Chek Softclix Lancets lancets, 1 each 3 (three) times daily., Disp: , Rfl:    atorvastatin (LIPITOR) 10 MG tablet, Take 1 tablet (10 mg total) by mouth daily., Disp: 90 tablet, Rfl: 1   Blood Glucose Monitoring Suppl DEVI, 1 each by Does not apply route in the morning, at noon, and at bedtime. May substitute to any manufacturer covered by patient's insurance., Disp: 1 each, Rfl: 0   metFORMIN (GLUCOPHAGE-XR) 500 MG 24 hr tablet, Take 1 tablet (500 mg total) by mouth 2 (two) times daily with a meal., Disp: 180 tablet, Rfl: 1   terbinafine (LAMISIL) 250 MG tablet, Take 1 tablet (250 mg total) by mouth daily. (Patient not taking:  Reported on 05/03/2023), Disp: 60 tablet, Rfl: 0   Objective:     BP 114/78   Pulse 89   Temp 98 F (36.7 C)   Ht 6\' 3"  (1.905 m)   Wt 185 lb 3.2 oz (84 kg)   SpO2 100%   BMI 23.15 kg/m    Physical Exam Constitutional:      General: He is not in acute distress.    Appearance: Normal appearance. He is not ill-appearing, toxic-appearing or diaphoretic.  HENT:     Head: Normocephalic and atraumatic.     Right Ear: External ear normal.     Left Ear: External ear normal.     Mouth/Throat:     Mouth: Mucous membranes are moist.     Pharynx: Oropharynx is clear. No oropharyngeal exudate or posterior oropharyngeal erythema.  Eyes:     General: No scleral icterus.       Right eye: No discharge.        Left eye: No discharge.     Extraocular Movements: Extraocular movements intact.     Conjunctiva/sclera: Conjunctivae normal.     Pupils: Pupils are equal, round, and reactive to light.  Cardiovascular:     Rate and Rhythm: Normal rate and regular rhythm.  Pulmonary:     Effort: Pulmonary effort is normal. No respiratory distress.     Breath sounds: Normal breath sounds. No wheezing, rhonchi or rales.  Musculoskeletal:     Cervical back:  No rigidity or tenderness.  Lymphadenopathy:     Cervical: No cervical adenopathy.  Skin:    General: Skin is warm and dry.  Neurological:     Mental Status: He is alert and oriented to person, place, and time.  Psychiatric:        Mood and Affect: Mood normal.        Behavior: Behavior normal.      No results found for any visits on 05/03/23.    The 10-year ASCVD risk score (Arnett DK, et al., 2019) is: 13.4%    Assessment & Plan:   Elevated cholesterol -     LDL cholesterol, direct -     Atorvastatin Calcium; Take 1 tablet (10 mg total) by mouth daily.  Dispense: 90 tablet; Refill: 1  Tobacco abuse disorder  Prediabetes -     metFORMIN HCl ER; Take 1 tablet (500 mg total) by mouth 2 (two) times daily with a meal.  Dispense:  180 tablet; Refill: 1 -     Basic metabolic panel -     Hemoglobin A1c    Return in about 6 months (around 11/01/2023).  Continue atorvastatin and metformin.  Information was given on her ideas for quitting smoking.  Encouraged him to do so.  Mliss Sax, MD

## 2023-05-04 LAB — BASIC METABOLIC PANEL
BUN: 11 mg/dL (ref 6–23)
CO2: 29 meq/L (ref 19–32)
Calcium: 9.7 mg/dL (ref 8.4–10.5)
Chloride: 103 meq/L (ref 96–112)
Creatinine, Ser: 0.99 mg/dL (ref 0.40–1.50)
GFR: 89.05 mL/min (ref >60.00)
Glucose, Bld: 103 mg/dL — ABNORMAL HIGH (ref 70–99)
Potassium: 4.1 meq/L (ref 3.5–5.1)
Sodium: 139 meq/L (ref 135–145)

## 2023-05-04 LAB — HEMOGLOBIN A1C: Hgb A1c MFr Bld: 5.6 % (ref 4.6–6.5)

## 2023-05-04 LAB — LDL CHOLESTEROL, DIRECT: Direct LDL: 80 mg/dL

## 2023-08-13 ENCOUNTER — Other Ambulatory Visit: Payer: Self-pay | Admitting: Family Medicine

## 2023-08-13 DIAGNOSIS — Z720 Tobacco use: Secondary | ICD-10-CM

## 2023-09-14 ENCOUNTER — Ambulatory Visit: Payer: Self-pay | Admitting: Family Medicine

## 2023-09-14 ENCOUNTER — Ambulatory Visit (INDEPENDENT_AMBULATORY_CARE_PROVIDER_SITE_OTHER)

## 2023-09-14 ENCOUNTER — Ambulatory Visit
Admission: EM | Admit: 2023-09-14 | Discharge: 2023-09-14 | Disposition: A | Discharge status: Home / Self Care | Attending: Family Medicine | Admitting: Family Medicine

## 2023-09-14 DIAGNOSIS — S96911A Strain of unspecified muscle and tendon at ankle and foot level, right foot, initial encounter: Secondary | ICD-10-CM | POA: Diagnosis not present

## 2023-09-14 DIAGNOSIS — M25571 Pain in right ankle and joints of right foot: Secondary | ICD-10-CM

## 2023-09-14 DIAGNOSIS — M7989 Other specified soft tissue disorders: Secondary | ICD-10-CM | POA: Diagnosis not present

## 2023-09-14 MED ORDER — ACETAMINOPHEN 325 MG PO TABS
650.0000 mg | ORAL_TABLET | Freq: Once | ORAL | Status: AC
  Administered 2023-09-14: 650 mg via ORAL

## 2023-09-14 NOTE — ED Triage Notes (Addendum)
 Pt presents with rt ankle pain. Reports he was putting up crates at work and after doing that he noticed his ankle started hurting when trying to walk on it. Pt states he has not been able to put pressure on his foot. Pt states he does not know how or if he injured it.

## 2023-09-14 NOTE — Telephone Encounter (Signed)
 Patient went to either ED or urgent care today. Dm/cma

## 2023-09-14 NOTE — Discharge Instructions (Addendum)
 Elevate and ice the ankle as you need to.  You may continue Tylenol or ibuprofen as needed.  You were given a dose of Tylenol while in the clinic.  Please follow-up with your PCP or podiatry if your symptoms do not improve.  Please go to the ER for any worsening symptoms.  Hope you feel better soon!

## 2023-09-14 NOTE — Telephone Encounter (Signed)
  Chief Complaint: ankle pain/injury  Symptoms: pain and swelling  Frequency: constant   Disposition: [] ED /[x] Urgent Care (no appt availability in office) / [] Appointment(In office/virtual)/ []  [facility]/ [] Home Care/ [] Refused Recommended Disposition /[] [facility]/ []  Follow-up with PCP Additional Notes: Pt calling with right ankle pain/swelling. Pt was at work moving equipment and noticed it. Pt doesn't remember twisting/stepping wrong. Pt stated it started out as limp and not he can't put weight on it. Pt rated pain 8/10. No [facility] providers can see pt today. RN advised pt go to an urgent and follow up with PCP. RN gave care advice and pt verbalized understanding.              Copied from CRM [id number]. Topic: Clinical - Red Word Triage >> Sep 14, 2023  1:40 PM Adele Barthel wrote: Red Word that prompted transfer to Nurse Triage:   While working today when pushing equipment, began to have right ankle pain Pain worse throughout the day. Had limp that has now progressed to being unable to put weight on ankle.  Pain is 8/10 Reason for Disposition  [1] Very swollen joint AND [2] no fever  Answer Assessment - Initial Assessment Questions 1. ONSET: "When did the pain start?"      Today  2. LOCATION: "Where is the pain located?"      Right-whole ankle  3. PAIN: "How bad is the pain?"    (Scale 1-10; or mild, moderate, severe)  - MILD (1-3): doesn't interfere with normal activities.   - MODERATE (4-7): interferes with normal activities (e.g., work or school) or awakens from sleep, limping.   - SEVERE (8-10): excruciating pain, unable to do any normal activities, unable to walk.      8 4. WORK OR EXERCISE: "Has there been any recent work or exercise that involved this part of the body?"      Pushing equipment and realized it  5. CAUSE: "What do you think is causing the ankle pain?"     Not sure  6. OTHER SYMPTOMS: "Do you have any other symptoms?" (e.g., calf  pain, rash, fever, swelling)     Swollen;  Protocols used: Ankle Pain-A-AH

## 2023-09-14 NOTE — ED Provider Notes (Signed)
 [facility]    CSN: [id number] Arrival date & time: 09/14/23  1457      History   Chief Complaint Chief Complaint  Patient presents with   Ankle Pain    HPI Edward Downs is a 53 y.o. male presents for ankle pain.  Patient reports he was at work today putting crates up on a shelf.  States he did not notice anything until after he was walking around and began having right ankle pain that worsened to where he is no longer able to put weight on the ankle.  Denies any injury.  States it feels swollen but no numbness tingling or bruising.  Reports a history of bunion surgery and removal of his right fifth distal metatarsal,  but otherwise no surgeries on the foot.  He has not taken any OTC medications for symptoms.  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 claim.  No other concerns at this time.   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 Authorizing Provider  Accu-Chek Softclix Lancets lancets 1 each 3 (three) times daily. 07/29/22   [provider]  atorvastatin (LIPITOR) 10 MG tablet Take 1 tablet (10 mg total) by mouth daily. 05/03/23   Mliss Sax, MD  Blood Glucose Monitoring Suppl DEVI 1 each by Does not apply route in the morning, at noon, and at bedtime. May substitute to any manufacturer covered by patient's insurance. 07/29/22   Mliss Sax, MD  metFORMIN (GLUCOPHAGE-XR) 500 MG 24 hr tablet Take 1 tablet (500 mg total) by mouth 2 (two) times daily with a meal. 05/03/23   Mliss Sax, MD  terbinafine (LAMISIL) 250 MG tablet Take 1 tablet (250 mg total) by mouth daily. Patient not taking: Reported on 05/03/2023 10/29/22   Lenn Sink, DPM  varenicline (CHANTIX) 1 MG tablet TAKE 1 TABLET BY MOUTH TWICE A DAY 08/15/23   Mliss Sax, MD    Family History Family History  Problem Relation Age of Onset   Diabetes Mother    Diabetes type II Sister    Esophageal cancer Neg Hx    Liver disease Neg Hx    Colon polyps Neg Hx     Social History Social History   Tobacco Use   Smoking status: Every Day    Current packs/day: 1.00  Types: Cigarettes   Smokeless tobacco: Never  Vaping Use   Vaping status: Never Used  Substance Use Topics   Alcohol use: No   Drug use: No     Allergies   Patient has no known allergies.   Review of Systems Review of Systems  Musculoskeletal:        Right ankle pain      Physical Exam Triage Vital Signs ED Triage Vitals  Encounter Vitals Group     BP 09/14/23 1526 131/85     Systolic BP Percentile --      Diastolic BP Percentile --      Pulse Rate 09/14/23 1526 100     Resp 09/14/23 1526 17     Temp 09/14/23 1526 98.6 F (37 C)     Temp Source 09/14/23 1526 Oral     SpO2 09/14/23 1526 98 %     Weight --      Height --      Head Circumference --      Peak Flow --      Pain Score  09/14/23 1524 7     Pain Loc --      Pain Education --      Exclude from Growth Chart --    No data found.  Updated Vital Signs BP 131/85 (BP Location: Right Arm)   Pulse 100   Temp 98.6 F (37 C) (Oral)   Resp 17   SpO2 98%   Visual Acuity Right Eye Distance:   Left Eye Distance:   Bilateral Distance:    Right Eye Near:   Left Eye Near:    Bilateral Near:     Physical Exam Vitals and nursing note reviewed.  Constitutional:      General: He is not in acute distress.    Appearance: Normal appearance. He is not ill-appearing.  HENT:     Head: Normocephalic and atraumatic.  Eyes:     Pupils: Pupils are equal, round, and reactive to light.  Cardiovascular:     Rate and Rhythm: Normal rate.  Pulmonary:     Effort: Pulmonary effort is normal.  Musculoskeletal:     Right ankle: No swelling, deformity, ecchymosis or lacerations. Tenderness present over the lateral malleolus, medial malleolus and base of 5th metatarsal. Decreased range of motion. Normal pulse.  Skin:    General: Skin is warm and dry.  Neurological:     General: No focal deficit present.     Mental Status: He is alert and oriented to person, place, and time.  Psychiatric:        Mood and Affect: Mood normal.        Behavior: Behavior normal.      UC Treatments / Results  Labs (all labs ordered are listed, but only abnormal results are displayed) Labs Reviewed - No data to display  EKG   Radiology No results found.  Procedures Procedures (including critical care time)  Medications Ordered in UC Medications  acetaminophen (TYLENOL) tablet 650 mg (650 mg Oral Given 09/14/23 1543)    Initial Impression / Assessment and Plan / UC Course  I have reviewed the triage vital signs and the nursing notes.  Pertinent labs & imaging results that were available during my care of the patient were reviewed by me and considered in my medical decision making (see chart for details).     Reviewed exam  and symptoms with patient.  No red flags.  Wet read of x-ray without obvious fracture.  Will contact for any positive results based on radiology overread.  Will treat as ankle strain.  Patient fitted with Aircast and discussed RICE therapy.  He was given Tylenol in clinic for pain and may continue this at home as needed.  Advised PCP or podiatry follow-up if symptoms do not improve.  ER precautions reviewed and patient verbalized understanding. Final Clinical Impressions(s) / UC Diagnoses   Final diagnoses:  Acute right ankle pain  Strain of right ankle, initial encounter     Discharge Instructions      Elevate and ice the ankle as you need to.  You may continue Tylenol or ibuprofen as needed.  You were given a dose of Tylenol while in the clinic.  Please follow-up with your PCP or podiatry if your symptoms do not improve.  Please go to the ER for any worsening symptoms.  Hope you feel better soon!     ED Prescriptions   None    PDMP not reviewed this encounter.   Radford Pax, NP 09/14/23 1620

## 2023-10-31 ENCOUNTER — Other Ambulatory Visit: Payer: Self-pay | Admitting: Family Medicine

## 2023-10-31 DIAGNOSIS — R7303 Prediabetes: Secondary | ICD-10-CM

## 2023-12-13 ENCOUNTER — Other Ambulatory Visit: Payer: Self-pay | Admitting: Family Medicine

## 2023-12-13 DIAGNOSIS — R7303 Prediabetes: Secondary | ICD-10-CM

## 2023-12-29 ENCOUNTER — Ambulatory Visit: Payer: Self-pay

## 2023-12-29 ENCOUNTER — Ambulatory Visit (INDEPENDENT_AMBULATORY_CARE_PROVIDER_SITE_OTHER): Admitting: Nurse Practitioner

## 2023-12-29 VITALS — BP 104/70 | HR 96 | Temp 98.1°F | Ht 75.0 in | Wt 180.0 lb

## 2023-12-29 DIAGNOSIS — K5901 Slow transit constipation: Secondary | ICD-10-CM

## 2023-12-29 DIAGNOSIS — J22 Unspecified acute lower respiratory infection: Secondary | ICD-10-CM | POA: Diagnosis not present

## 2023-12-29 LAB — POC COVID19 BINAXNOW: SARS Coronavirus 2 Ag: NEGATIVE

## 2023-12-29 LAB — POCT RAPID STREP A (OFFICE): Rapid Strep A Screen: NEGATIVE

## 2023-12-29 MED ORDER — DOXYCYCLINE HYCLATE 100 MG PO TABS: 100.0000 mg | ORAL_TABLET | Freq: Two times a day (BID) | ORAL | 0 refills | Status: AC

## 2023-12-29 NOTE — Patient Instructions (Addendum)
 It was great to see you!  Start doxycycline  twice a day for 7 days with food  Keep taking tylenol  as needed for body aches and fever  Drink plenty of fluids  Start miralax daily for constipation   You can go back to work on 12/31/23 as long as you don't have a fever  Let's follow-up if your symptoms worsen or don't improve.   Take care,  Tinnie Harada, NP

## 2023-12-29 NOTE — Progress Notes (Signed)
 Acute Office Visit  Subjective:     Patient ID: Edward Downs, male    DOB: 16-Feb-1971, 53 y.o.   MRN: [id number]  Chief Complaint  Patient presents with   Dizziness    Muscles aches, fever, coughing, headache   HPI:  Discussed the use of AI scribe software for clinical note transcription with the patient, who gave verbal consent to proceed.  History of Present Illness   Edward Downs is a 53 year old male who presents with muscle pain, shortness of breath, and fever.  Muscle pain began on Tuesday and affects the entire body. Shortness of breath and dizziness started on Wednesday. Fever is measured at 100.79F, with suspicion of being higher earlier. Discomfort when swallowing is present, described as a sensation of something in the throat causing pain. He has also been experiencing a cough.   Constipation persists despite taking Dulcolax. Tylenol  was taken this morning for symptom relief. There is a slight cough and headaches. No sore throat, nasal congestion, joint pain, ear pain, or rashes. He has not been around anyone known to be sick recently.  He smokes half a pack of cigarettes daily and works at [employer]. No ticks or rashes have been noticed.      ROS See pertinent positives and negatives per HPI.     Objective:    BP 104/70 (BP Location: Left Arm, Patient Position: Sitting, Cuff Size: Normal)   Pulse 96   Temp 98.1 F (36.7 C) (Oral)   Ht 6' 3 (1.905 m)   Wt 180 lb (81.6 kg)   SpO2 98%   BMI 22.50 kg/m    Orthostatic VS for the past 72 hrs (Last 3 readings):  Orthostatic BP Patient Position BP Location Cuff Size Orthostatic Pulse  12/29/23 1332 98/72 Standing Right Arm Normal 105  12/29/23 1331 100/72 Sitting Right Arm Normal 97  12/29/23 1330 98/68 Supine Right Arm Normal 92     Physical Exam Vitals and nursing note reviewed.  Constitutional:      Appearance: Normal appearance.  HENT:     Head: Normocephalic.   Eyes:      Conjunctiva/sclera: Conjunctivae normal.    Cardiovascular:     Rate and Rhythm: Normal rate and regular rhythm.     Pulses: Normal pulses.     Heart sounds: Normal heart sounds.  Pulmonary:     Effort: Pulmonary effort is normal.     Breath sounds: Normal breath sounds.   Musculoskeletal:     Cervical back: Normal range of motion and neck supple. Tenderness present.  Lymphadenopathy:     Cervical: Cervical adenopathy (left submandibular) present.   Skin:    General: Skin is warm.   Neurological:     General: No focal deficit present.     Mental Status: He is alert and oriented to person, place, and time.   Psychiatric:        Mood and Affect: Mood normal.        Behavior: Behavior normal.        Thought Content: Thought content normal.        Judgment: Judgment normal.     Results for orders placed or performed in visit on 12/29/23  POCT rapid strep A  Result Value Ref Range   Rapid Strep A Screen Negative Negative  POC COVID-19 BinaxNow  Result Value Ref Range   SARS Coronavirus 2 Ag Negative Negative        Assessment & Plan:  Lower Respiratory Infection POC Covid-19 and strep negative. He does smoke 1/2 ppd and was diagnosed with boronchitis in the past. Continue acetaminophen  for fever and myalgia. Encourage increased fluid intake. Prescribe doxycycline  100mg  BID x7 days. Advise warm compresses for cervical lymphadenopathy. He should remain off work until afebrile for 24 hours, and a work excuse note is provided. Instruct to call if symptoms worsen, dyspnea increases, or a rash develops.  Slow transit Constipation   Chronic constipation persists with inadequate relief from bisacodyl. Recommend miralax daily as needed. Advise increasing fluid intake.   Meds ordered this encounter  Medications   doxycycline  (VIBRA -TABS) 100 MG tablet    Sig: Take 1 tablet (100 mg total) by mouth 2 (two) times daily.    Dispense:  14 tablet    Refill:  0    Return if  symptoms worsen or fail to improve.  Edward DELENA Harada, NP

## 2023-12-29 NOTE — Telephone Encounter (Signed)
 FYI Only or Action Required?: FYI only for provider.  Patient was last seen in primary care on 05/03/2023 by Berneta Elsie Sayre, MD. Called Nurse Triage reporting No chief complaint on file.. Symptoms began several days ago. Interventions attempted: Nothing. Symptoms are: gradually worsening.  Triage Disposition: See HCP Within 4 Hours (Or PCP Triage)  Patient/caregiver understands and will follow disposition?: Yes   Offered earlier appt, has another appt this morning.      Copied from CRM [id number]. Topic: Clinical - Red Word Triage >> Dec 29, 2023  8:03 AM Robinson H wrote: [facility] that prompted transfer to Nurse Triage: Muscle soreness, shortness of breath, headache, fever 100.7, hurts swallow, yesterday a little of dizziness, fatigue Reason for Disposition  MILD difficulty breathing (e.g., minimal/no SOB at rest, SOB with walking, pulse <100)  Answer Assessment - Initial Assessment Questions 1. COVID-19 DIAGNOSIS: How do you know that you have COVID? (e.g., positive lab test or self-test, diagnosed by doctor or NP/PA, symptoms after exposure).     no 2. COVID-19 EXPOSURE: Was there any known exposure to COVID before the symptoms began? CDC Definition of close contact: within 6 feet (2 meters) for a total of 15 minutes or more over a 24-hour period.      no 3. ONSET: When did the COVID-19 symptoms start?      Monday with headache 4. WORST SYMPTOM: What is your worst symptom? (e.g., cough, fever, shortness of breath, muscle aches)     Muscle soreness, sore throat, fever, cough, sob 5. COUGH: Do you have a cough? If Yes, ask: How bad is the cough?       mild 6. FEVER: Do you have a fever? If Yes, ask: What is your temperature, how was it measured, and when did it start?     100.7- last night 7. RESPIRATORY STATUS: Describe your breathing? (e.g., normal; shortness of breath, wheezing, unable to speak)      mild 8. BETTER-SAME-WORSE: Are you getting better,  staying the same or getting worse compared to yesterday?  If getting worse, ask, In what way?     worse 9. OTHER SYMPTOMS: Do you have any other symptoms?  (e.g., chills, fatigue, headache, loss of smell or taste, muscle pain, sore throat)     no 10. HIGH RISK DISEASE: Do you have any chronic medical problems? (e.g., asthma, heart or lung disease, weak immune system, obesity, etc.)       no  Protocols used: Coronavirus (COVID-19) Diagnosed or Suspected-A-AH

## 2024-03-18 ENCOUNTER — Other Ambulatory Visit: Payer: Self-pay | Admitting: Family Medicine

## 2024-03-18 DIAGNOSIS — R7303 Prediabetes: Secondary | ICD-10-CM

## 2024-05-24 ENCOUNTER — Other Ambulatory Visit: Payer: Self-pay | Admitting: Family Medicine

## 2024-05-24 DIAGNOSIS — E7800 Pure hypercholesterolemia, unspecified: Secondary | ICD-10-CM

## 2024-06-13 ENCOUNTER — Other Ambulatory Visit: Payer: Self-pay | Admitting: Family Medicine

## 2024-06-13 DIAGNOSIS — E7800 Pure hypercholesterolemia, unspecified: Secondary | ICD-10-CM

## 2024-06-13 NOTE — Telephone Encounter (Signed)
 Copied from CRM #[id number]. Topic: Clinical - Medication Refill >> Jun 13, 2024  1:45 PM Ashley R wrote: Medication: atorvastatin  (LIPITOR) 10 MG tablet  atorvastatin  (LIPITOR) 10 MG tablet   Has the patient contacted their pharmacy? Yes  This is the patient's preferred pharmacy:  [id number] - [place], [place] - [address] [place] [address] Phone: [phone] Fax: [phone]  Is this the correct pharmacy for this prescription? Yes If no, delete pharmacy and type the correct one.   Has the prescription been filled recently? Yes  Is the patient out of the medication? No, 4 left of each  Has the patient been seen for an appointment in the last year OR does the patient have an upcoming appointment? Yes, scheduled for Jan 29.   Can we respond through MyChart? Yes  Agent: Please be advised that Rx refills may take up to 3 business days. We ask that you follow-up with your pharmacy.

## 2024-06-14 MED ORDER — ATORVASTATIN CALCIUM 10 MG PO TABS: 10.0000 mg | ORAL_TABLET | Freq: Every day | ORAL | 1 refills | Status: AC

## 2024-06-15 ENCOUNTER — Other Ambulatory Visit: Payer: Self-pay | Admitting: Family Medicine

## 2024-06-15 DIAGNOSIS — R7303 Prediabetes: Secondary | ICD-10-CM

## 2024-08-02 ENCOUNTER — Telehealth: Payer: Self-pay | Admitting: Family Medicine

## 2024-08-02 ENCOUNTER — Ambulatory Visit: Admitting: Family Medicine

## 2024-08-02 NOTE — Telephone Encounter (Signed)
 Pt arrived for his 3:00 appt at 3:15. When I let him know he needed to reschedule he started giving me a hard time. I explained the 10 min grace period and he said I was checking in online at 3:10 in the car. Carmen stepped up and reiterated what I said. While I was trying to reschedule he was on his phone and then turned and walked out.

## 2024-08-03 ENCOUNTER — Ambulatory Visit: Admitting: Family Medicine

## 2024-08-07 ENCOUNTER — Ambulatory Visit: Admitting: Family Medicine

## 2024-09-07 ENCOUNTER — Ambulatory Visit: Admitting: Family Medicine
